# Patient Record
Sex: Male | Born: 1970 | Race: Black or African American | Hispanic: No | Marital: Single | Smoking: Never smoker
Health system: Southern US, Community
[De-identification: ages and names within clinical notes are randomized; demographics above are authoritative.]

---

## 2000-04-16 ENCOUNTER — Emergency Department (HOSPITAL_COMMUNITY): Admission: EM | Admit: 2000-04-16 | Discharge: 2000-04-16 | Payer: Self-pay | Admitting: Emergency Medicine

## 2000-05-29 ENCOUNTER — Encounter: Payer: Self-pay | Admitting: Emergency Medicine

## 2000-05-29 ENCOUNTER — Emergency Department (HOSPITAL_COMMUNITY): Admission: EM | Admit: 2000-05-29 | Discharge: 2000-05-29 | Payer: Self-pay | Admitting: Emergency Medicine

## 2001-03-14 ENCOUNTER — Encounter: Payer: Self-pay | Admitting: Family Medicine

## 2001-03-14 ENCOUNTER — Encounter: Admission: RE | Admit: 2001-03-14 | Discharge: 2001-03-14 | Payer: Self-pay | Admitting: Family Medicine

## 2001-10-01 ENCOUNTER — Encounter: Admission: RE | Admit: 2001-10-01 | Discharge: 2001-10-01 | Payer: Self-pay | Admitting: Orthopedic Surgery

## 2001-10-01 ENCOUNTER — Encounter: Payer: Self-pay | Admitting: Orthopedic Surgery

## 2005-03-07 ENCOUNTER — Emergency Department (HOSPITAL_COMMUNITY): Admission: EM | Admit: 2005-03-07 | Discharge: 2005-03-08 | Payer: Self-pay | Admitting: Emergency Medicine

## 2005-12-28 ENCOUNTER — Emergency Department (HOSPITAL_COMMUNITY): Admission: EM | Admit: 2005-12-28 | Discharge: 2005-12-28 | Payer: Self-pay | Admitting: Emergency Medicine

## 2006-03-14 ENCOUNTER — Inpatient Hospital Stay (HOSPITAL_COMMUNITY): Admission: RE | Admit: 2006-03-14 | Discharge: 2006-03-17 | Payer: Self-pay | Admitting: *Deleted

## 2006-03-14 ENCOUNTER — Emergency Department (HOSPITAL_COMMUNITY): Admission: EM | Admit: 2006-03-14 | Discharge: 2006-03-14 | Payer: Self-pay | Admitting: Emergency Medicine

## 2006-03-15 ENCOUNTER — Ambulatory Visit: Payer: Self-pay | Admitting: *Deleted

## 2006-08-30 IMAGING — CT CT CERVICAL SPINE W/O CM
2 of 3 series · 13 of 20 positions shown, 16 images · IV contrast (agent unspecified)
Comparison: None available.

CLINICAL DATA: Neck pain following attempted suicide by hanging.  
 CERVICAL SPINE CT WITHOUT CONTRAST:
TECHNIQUE: Multidetector CT imaging of the cervical spine was performed.  Multiplanar CT image reconstructions were also generated.

[Series 3: recon 2: · axial · 0.31mm/px · z∈[+136,+301]mm · 12 of 79 slices shown, 15 images]
[im 7/79  soft-tissue]
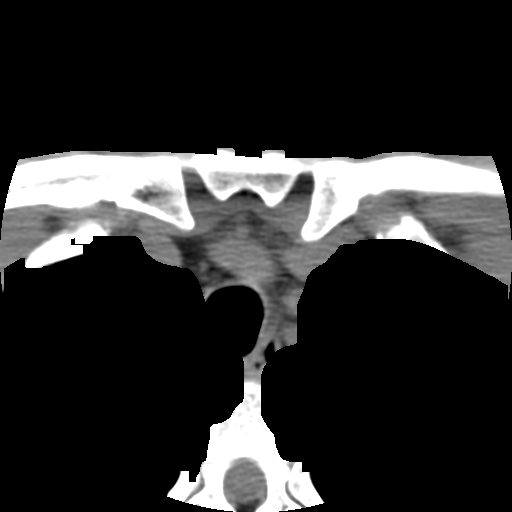
[im 7/79  bone]
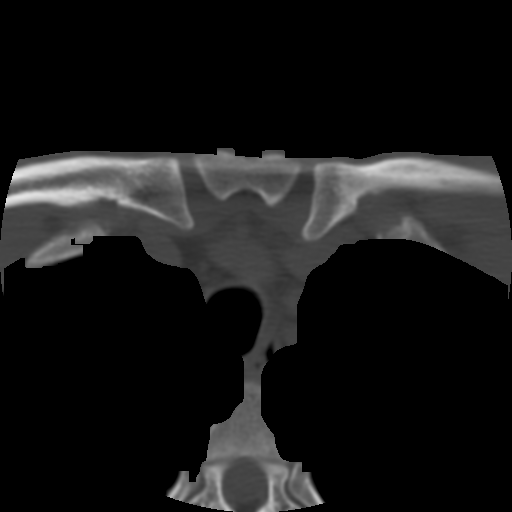
[im 13/79  bone]
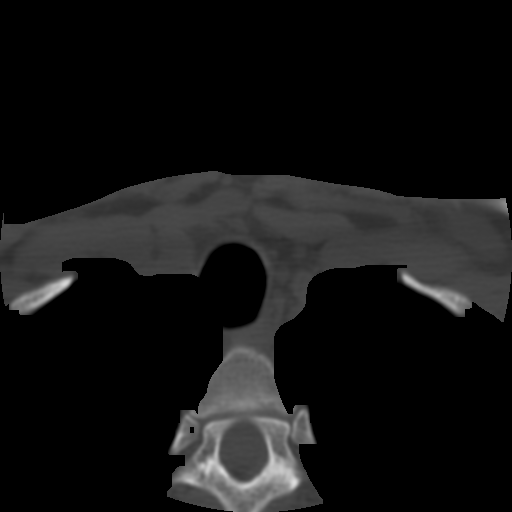
[im 19/79  bone]
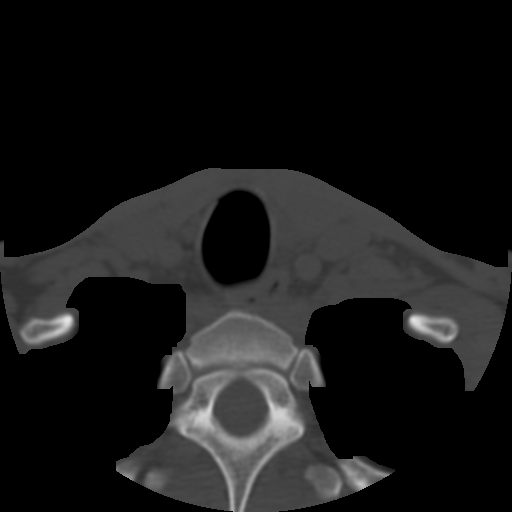
[im 25/79  bone]
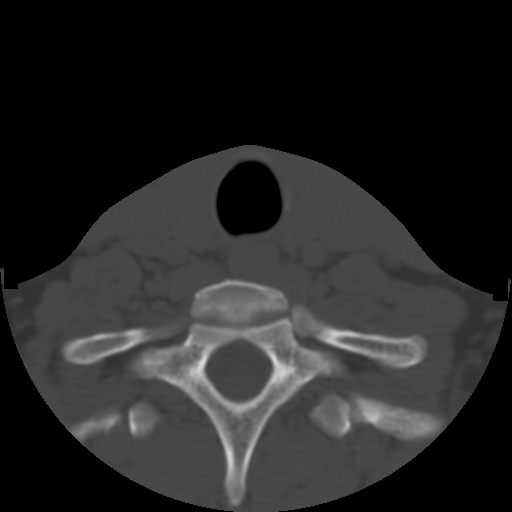
[im 31/79  soft-tissue]
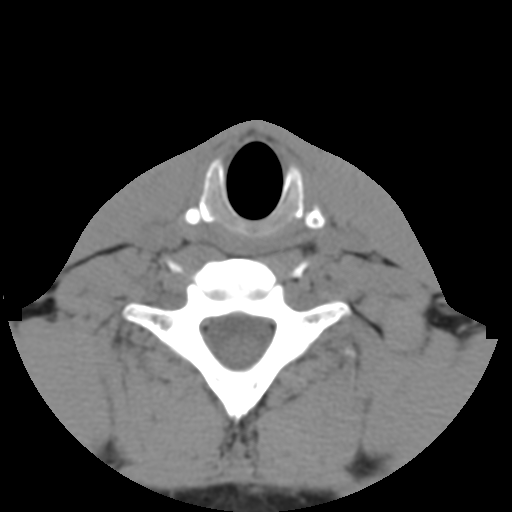
[im 31/79  bone]
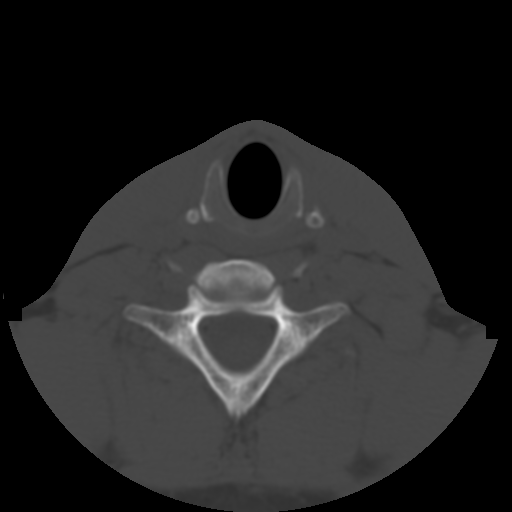
[im 37/79  bone]
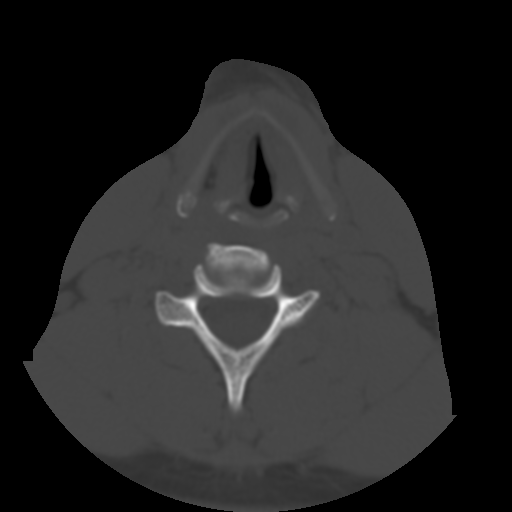
[im 43/79  bone]
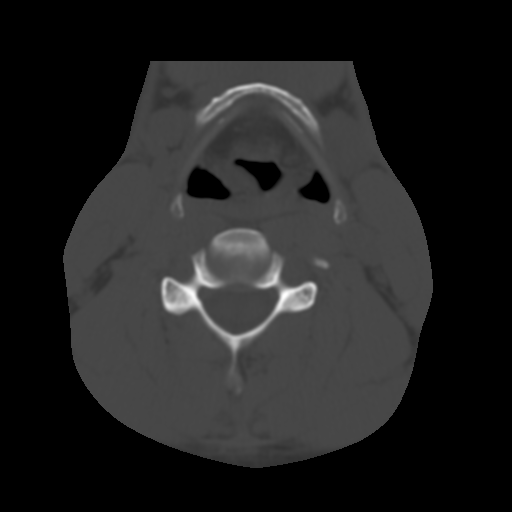
[im 49/79  bone]
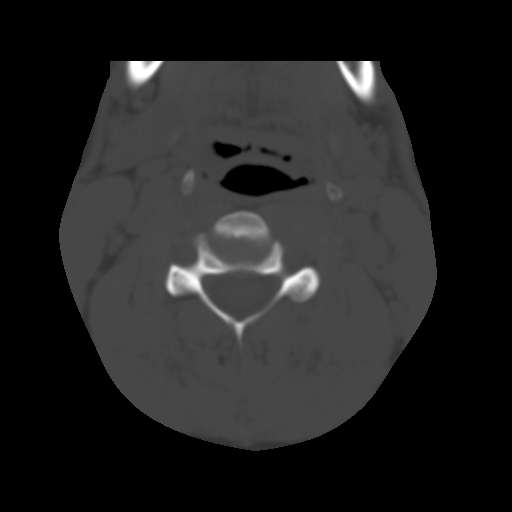
[im 55/79  soft-tissue]
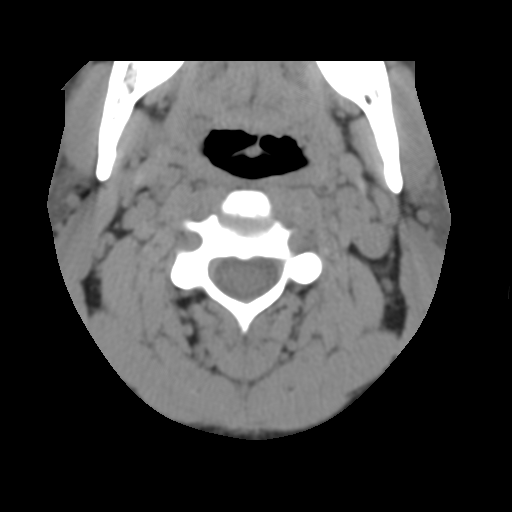
[im 55/79  bone]
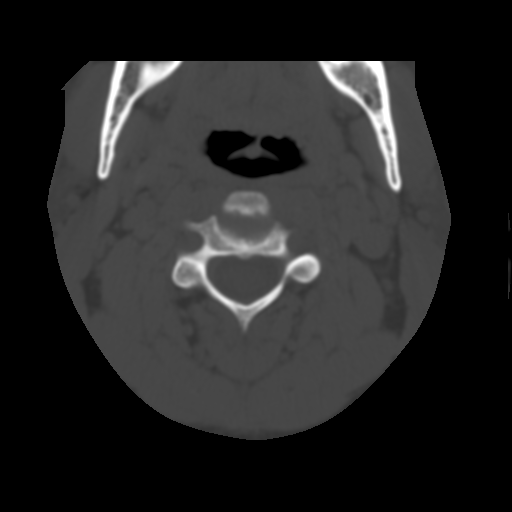
[im 61/79  bone]
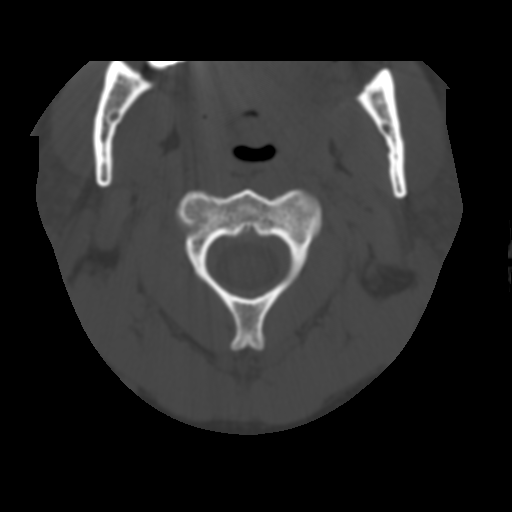
[im 67/79  bone]
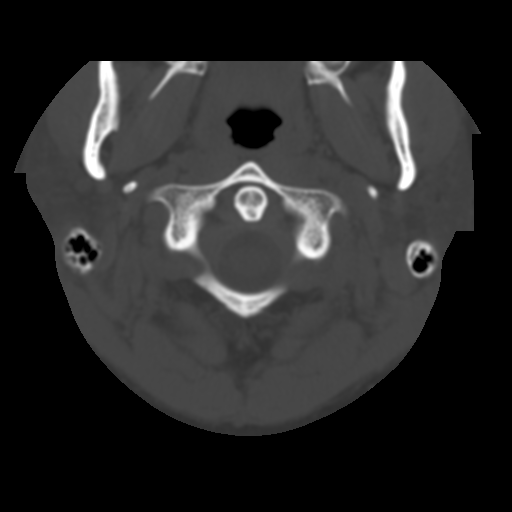
[im 73/79  bone]
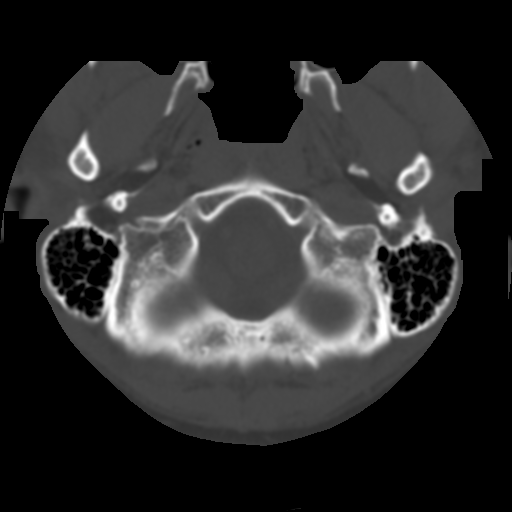

[Series 104: reformatted · coronal · 0.39mm/px · 1 of 40 slices shown]
[im 20/40  bone]
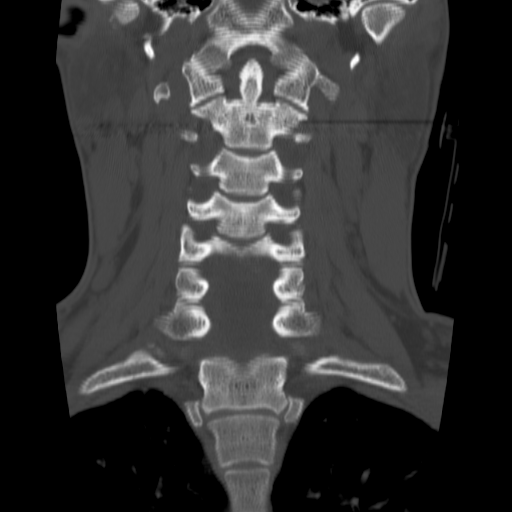

[13 of 20 positions shown; findings below may reference images not displayed]

FINDINGS: In three planes no cervical fractures or subluxations noted.  Prevertebral soft tissues normal.  Disc height is preserved.
IMPRESSION: Normal exam.

## 2011-06-16 ENCOUNTER — Emergency Department (HOSPITAL_COMMUNITY)
Admission: EM | Admit: 2011-06-16 | Discharge: 2011-06-16 | Disposition: A | Discharge status: Home / Self Care | Payer: BC Managed Care – PPO | Attending: Emergency Medicine | Admitting: Emergency Medicine

## 2011-06-16 ENCOUNTER — Emergency Department (HOSPITAL_COMMUNITY): Payer: BC Managed Care – PPO

## 2011-06-16 DIAGNOSIS — R0602 Shortness of breath: Secondary | ICD-10-CM | POA: Insufficient documentation

## 2011-06-16 DIAGNOSIS — R079 Chest pain, unspecified: Secondary | ICD-10-CM | POA: Insufficient documentation

## 2011-06-16 DIAGNOSIS — K209 Esophagitis, unspecified without bleeding: Secondary | ICD-10-CM | POA: Insufficient documentation

## 2011-06-16 LAB — CK TOTAL AND CKMB (NOT AT ARMC)
Relative Index: 1.1 (ref 0.0–2.5)
Total CK: 114 U/L (ref 7–232)

## 2011-06-16 LAB — RAPID URINE DRUG SCREEN, HOSP PERFORMED: Opiates: POSITIVE — AB

## 2011-06-16 LAB — COMPREHENSIVE METABOLIC PANEL
ALT: 22 U/L (ref 0–53)
Albumin: 3.6 g/dL (ref 3.5–5.2)
Alkaline Phosphatase: 61 U/L (ref 39–117)
BUN: 16 mg/dL (ref 6–23)
Calcium: 9.3 mg/dL (ref 8.4–10.5)
GFR calc Af Amer: >60 mL/min (ref >60)
Glucose, Bld: 100 mg/dL — ABNORMAL HIGH (ref 70–99)
Potassium: 4.8 mEq/L (ref 3.5–5.1)
Sodium: 138 mEq/L (ref 135–145)
Total Protein: 7.1 g/dL (ref 6.0–8.3)

## 2011-06-16 LAB — DIFFERENTIAL
Basophils Absolute: 0 10*3/uL (ref 0.0–0.1)
Basophils Relative: 0 % (ref 0–1)
Eosinophils Relative: 3 % (ref 0–5)
Monocytes Absolute: 0.3 10*3/uL (ref 0.1–1.0)
Monocytes Relative: 9 % (ref 3–12)

## 2011-06-16 LAB — TROPONIN I: Troponin I: <0.3 ng/mL (ref <0.30)

## 2011-06-16 LAB — CBC
Hemoglobin: 13.5 g/dL (ref 13.0–17.0)
MCH: 27.3 pg (ref 26.0–34.0)
MCHC: 33.2 g/dL (ref 30.0–36.0)
RDW: 14.1 % (ref 11.5–15.5)

## 2012-02-16 IMAGING — CR DG CHEST 1V PORT
1 series · 1 of 1 positions shown · non-contrast
Comparison: None

CLINICAL DATA: Chest pain and shortness of breath.

PORTABLE CHEST - 1 VIEW

[AP]
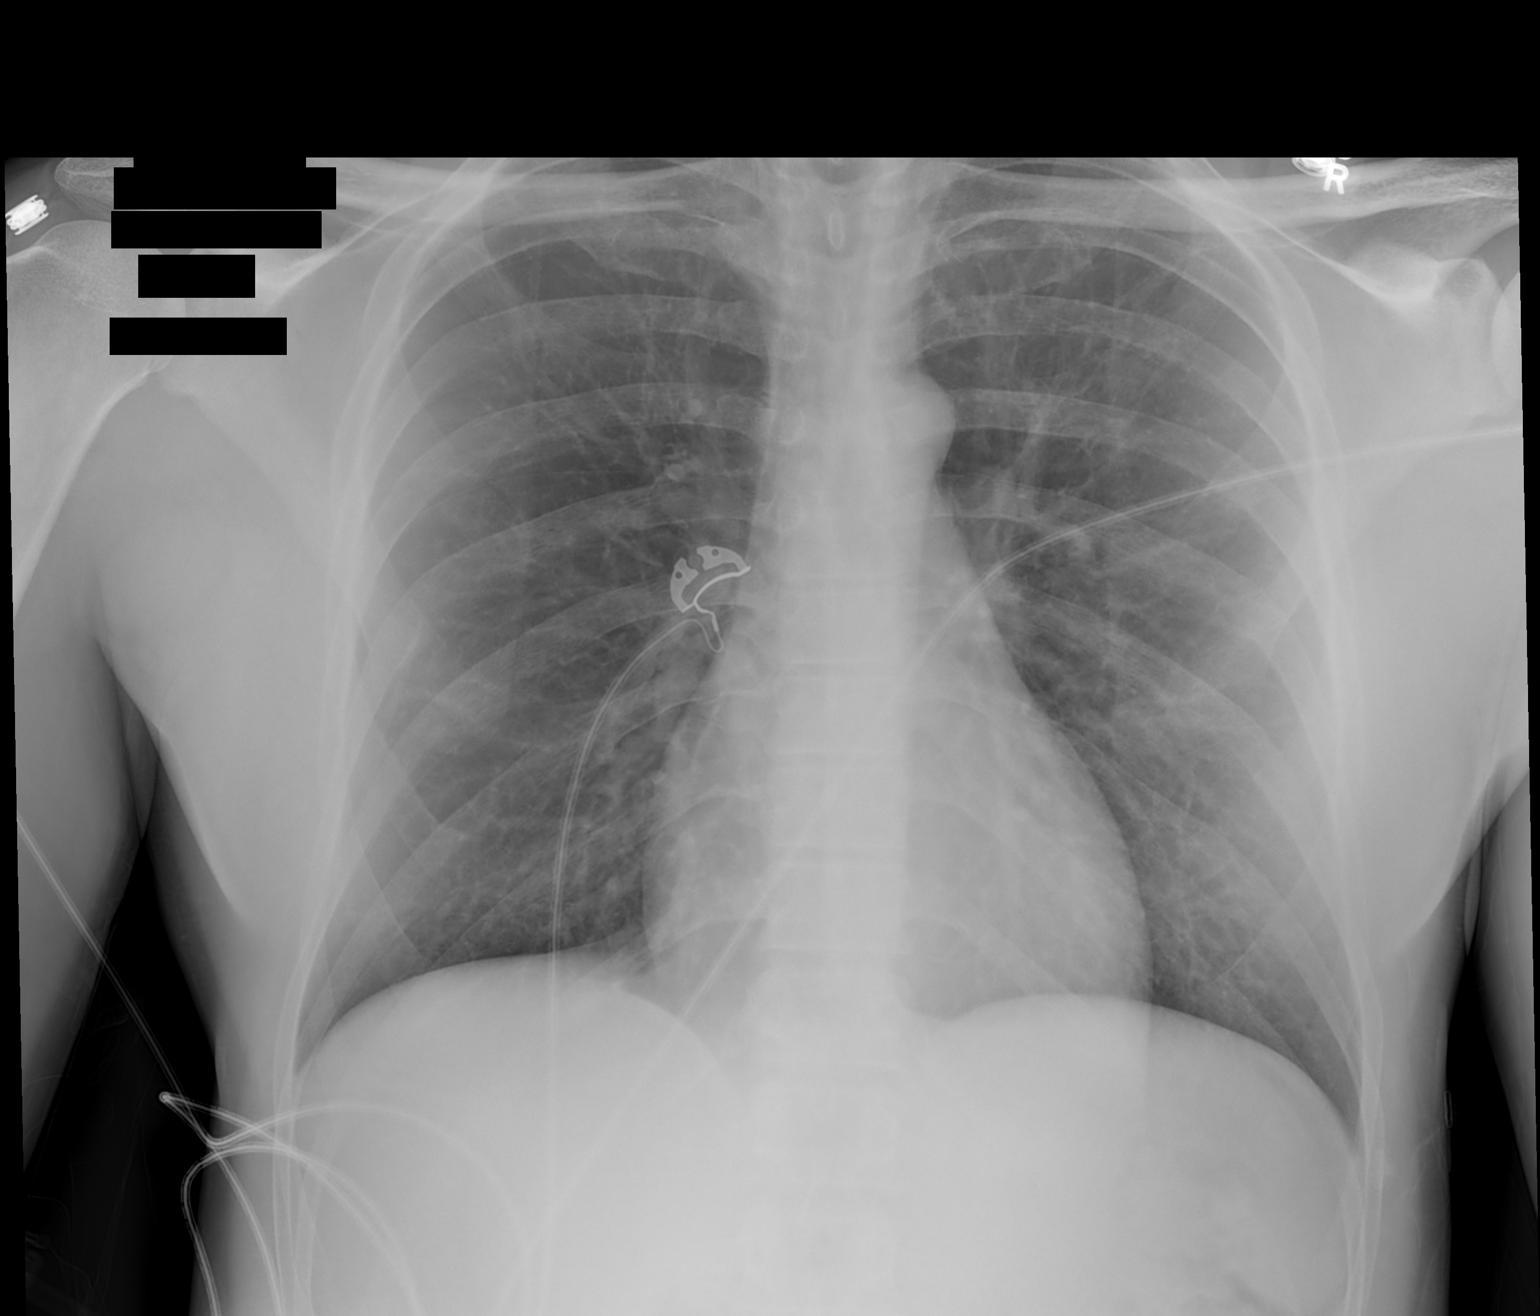

[1 of 1 positions shown; findings below may reference images not displayed]

FINDINGS: The cardiac silhouette, mediastinal and hilar contours
are within normal limits.  The lungs are clear.  The bony thorax is
intact.
IMPRESSION: No acute cardiopulmonary findings.

## 2016-09-12 DIAGNOSIS — L0232 Furuncle of buttock: Secondary | ICD-10-CM | POA: Diagnosis not present

## 2016-09-12 DIAGNOSIS — L304 Erythema intertrigo: Secondary | ICD-10-CM | POA: Diagnosis not present

## 2016-10-09 DIAGNOSIS — R350 Frequency of micturition: Secondary | ICD-10-CM | POA: Diagnosis not present

## 2016-10-09 DIAGNOSIS — I10 Essential (primary) hypertension: Secondary | ICD-10-CM | POA: Diagnosis not present

## 2016-10-31 DIAGNOSIS — N5201 Erectile dysfunction due to arterial insufficiency: Secondary | ICD-10-CM | POA: Diagnosis not present

## 2016-10-31 DIAGNOSIS — N400 Enlarged prostate without lower urinary tract symptoms: Secondary | ICD-10-CM | POA: Diagnosis not present

## 2017-03-12 DIAGNOSIS — Z131 Encounter for screening for diabetes mellitus: Secondary | ICD-10-CM | POA: Diagnosis not present

## 2017-03-12 DIAGNOSIS — J45909 Unspecified asthma, uncomplicated: Secondary | ICD-10-CM | POA: Diagnosis not present

## 2017-03-12 DIAGNOSIS — Z0001 Encounter for general adult medical examination with abnormal findings: Secondary | ICD-10-CM | POA: Diagnosis not present

## 2017-03-12 DIAGNOSIS — Z125 Encounter for screening for malignant neoplasm of prostate: Secondary | ICD-10-CM | POA: Diagnosis not present

## 2017-03-12 DIAGNOSIS — J040 Acute laryngitis: Secondary | ICD-10-CM | POA: Diagnosis not present

## 2017-03-12 DIAGNOSIS — Z13220 Encounter for screening for lipoid disorders: Secondary | ICD-10-CM | POA: Diagnosis not present

## 2017-03-12 DIAGNOSIS — Z23 Encounter for immunization: Secondary | ICD-10-CM | POA: Diagnosis not present

## 2017-08-09 DIAGNOSIS — M25512 Pain in left shoulder: Secondary | ICD-10-CM | POA: Diagnosis not present

## 2017-08-29 DIAGNOSIS — M25512 Pain in left shoulder: Secondary | ICD-10-CM | POA: Diagnosis not present

## 2017-09-10 DIAGNOSIS — S46812A Strain of other muscles, fascia and tendons at shoulder and upper arm level, left arm, initial encounter: Secondary | ICD-10-CM | POA: Diagnosis not present

## 2017-09-21 DIAGNOSIS — S46812D Strain of other muscles, fascia and tendons at shoulder and upper arm level, left arm, subsequent encounter: Secondary | ICD-10-CM | POA: Diagnosis not present

## 2017-09-26 DIAGNOSIS — S43432D Superior glenoid labrum lesion of left shoulder, subsequent encounter: Secondary | ICD-10-CM | POA: Diagnosis not present

## 2017-10-16 DIAGNOSIS — S43432A Superior glenoid labrum lesion of left shoulder, initial encounter: Secondary | ICD-10-CM | POA: Diagnosis not present

## 2017-10-16 DIAGNOSIS — M24112 Other articular cartilage disorders, left shoulder: Secondary | ICD-10-CM | POA: Diagnosis not present

## 2017-10-16 DIAGNOSIS — G8918 Other acute postprocedural pain: Secondary | ICD-10-CM | POA: Diagnosis not present

## 2017-10-16 DIAGNOSIS — M7542 Impingement syndrome of left shoulder: Secondary | ICD-10-CM | POA: Diagnosis not present

## 2017-10-16 DIAGNOSIS — S46912A Strain of unspecified muscle, fascia and tendon at shoulder and upper arm level, left arm, initial encounter: Secondary | ICD-10-CM | POA: Diagnosis not present

## 2017-10-16 DIAGNOSIS — M25512 Pain in left shoulder: Secondary | ICD-10-CM | POA: Diagnosis not present

## 2017-10-16 DIAGNOSIS — I890 Lymphedema, not elsewhere classified: Secondary | ICD-10-CM | POA: Diagnosis not present

## 2017-10-27 DIAGNOSIS — M25512 Pain in left shoulder: Secondary | ICD-10-CM | POA: Diagnosis not present

## 2017-10-27 DIAGNOSIS — S46912A Strain of unspecified muscle, fascia and tendon at shoulder and upper arm level, left arm, initial encounter: Secondary | ICD-10-CM | POA: Diagnosis not present

## 2017-10-27 DIAGNOSIS — S43432A Superior glenoid labrum lesion of left shoulder, initial encounter: Secondary | ICD-10-CM | POA: Diagnosis not present

## 2017-10-27 DIAGNOSIS — I890 Lymphedema, not elsewhere classified: Secondary | ICD-10-CM | POA: Diagnosis not present

## 2017-11-26 DIAGNOSIS — M25612 Stiffness of left shoulder, not elsewhere classified: Secondary | ICD-10-CM | POA: Diagnosis not present

## 2017-12-03 DIAGNOSIS — M25612 Stiffness of left shoulder, not elsewhere classified: Secondary | ICD-10-CM | POA: Diagnosis not present

## 2017-12-07 DIAGNOSIS — M25612 Stiffness of left shoulder, not elsewhere classified: Secondary | ICD-10-CM | POA: Diagnosis not present

## 2017-12-11 DIAGNOSIS — M25612 Stiffness of left shoulder, not elsewhere classified: Secondary | ICD-10-CM | POA: Diagnosis not present

## 2017-12-14 DIAGNOSIS — M25612 Stiffness of left shoulder, not elsewhere classified: Secondary | ICD-10-CM | POA: Diagnosis not present

## 2017-12-17 DIAGNOSIS — M25612 Stiffness of left shoulder, not elsewhere classified: Secondary | ICD-10-CM | POA: Diagnosis not present

## 2017-12-21 DIAGNOSIS — M25612 Stiffness of left shoulder, not elsewhere classified: Secondary | ICD-10-CM | POA: Diagnosis not present

## 2017-12-26 DIAGNOSIS — S43432D Superior glenoid labrum lesion of left shoulder, subsequent encounter: Secondary | ICD-10-CM | POA: Diagnosis not present

## 2017-12-26 DIAGNOSIS — M7542 Impingement syndrome of left shoulder: Secondary | ICD-10-CM | POA: Diagnosis not present

## 2018-01-21 DIAGNOSIS — Z4789 Encounter for other orthopedic aftercare: Secondary | ICD-10-CM | POA: Diagnosis not present

## 2018-01-21 DIAGNOSIS — S46812D Strain of other muscles, fascia and tendons at shoulder and upper arm level, left arm, subsequent encounter: Secondary | ICD-10-CM | POA: Diagnosis not present

## 2018-04-10 DIAGNOSIS — R21 Rash and other nonspecific skin eruption: Secondary | ICD-10-CM | POA: Diagnosis not present

## 2018-04-10 DIAGNOSIS — L299 Pruritus, unspecified: Secondary | ICD-10-CM | POA: Diagnosis not present

## 2018-05-29 DIAGNOSIS — Z760 Encounter for issue of repeat prescription: Secondary | ICD-10-CM | POA: Diagnosis not present

## 2018-05-29 DIAGNOSIS — B009 Herpesviral infection, unspecified: Secondary | ICD-10-CM | POA: Diagnosis not present

## 2018-10-15 DIAGNOSIS — B356 Tinea cruris: Secondary | ICD-10-CM | POA: Diagnosis not present

## 2019-06-09 DIAGNOSIS — B372 Candidiasis of skin and nail: Secondary | ICD-10-CM | POA: Diagnosis not present

## 2019-08-13 DIAGNOSIS — U071 COVID-19: Secondary | ICD-10-CM | POA: Diagnosis not present

## 2019-12-06 DIAGNOSIS — Z0131 Encounter for examination of blood pressure with abnormal findings: Secondary | ICD-10-CM | POA: Diagnosis not present

## 2020-04-13 DIAGNOSIS — L739 Follicular disorder, unspecified: Secondary | ICD-10-CM | POA: Diagnosis not present

## 2020-04-13 DIAGNOSIS — B356 Tinea cruris: Secondary | ICD-10-CM | POA: Diagnosis not present

## 2020-04-13 DIAGNOSIS — J45909 Unspecified asthma, uncomplicated: Secondary | ICD-10-CM | POA: Diagnosis not present

## 2020-05-10 DIAGNOSIS — B009 Herpesviral infection, unspecified: Secondary | ICD-10-CM | POA: Diagnosis not present

## 2020-05-10 DIAGNOSIS — J45909 Unspecified asthma, uncomplicated: Secondary | ICD-10-CM | POA: Diagnosis not present

## 2020-06-09 DIAGNOSIS — R030 Elevated blood-pressure reading, without diagnosis of hypertension: Secondary | ICD-10-CM | POA: Diagnosis not present

## 2020-06-09 DIAGNOSIS — N529 Male erectile dysfunction, unspecified: Secondary | ICD-10-CM | POA: Diagnosis not present

## 2020-06-09 DIAGNOSIS — J4520 Mild intermittent asthma, uncomplicated: Secondary | ICD-10-CM | POA: Diagnosis not present

## 2020-06-09 DIAGNOSIS — Z125 Encounter for screening for malignant neoplasm of prostate: Secondary | ICD-10-CM | POA: Diagnosis not present

## 2020-06-09 DIAGNOSIS — Z131 Encounter for screening for diabetes mellitus: Secondary | ICD-10-CM | POA: Diagnosis not present

## 2020-06-09 DIAGNOSIS — Z0000 Encounter for general adult medical examination without abnormal findings: Secondary | ICD-10-CM | POA: Diagnosis not present

## 2020-06-09 DIAGNOSIS — R413 Other amnesia: Secondary | ICD-10-CM | POA: Diagnosis not present

## 2020-06-09 DIAGNOSIS — Z13220 Encounter for screening for lipoid disorders: Secondary | ICD-10-CM | POA: Diagnosis not present

## 2022-07-14 DIAGNOSIS — B356 Tinea cruris: Secondary | ICD-10-CM | POA: Diagnosis not present

## 2022-10-19 DIAGNOSIS — S0500XA Injury of conjunctiva and corneal abrasion without foreign body, unspecified eye, initial encounter: Secondary | ICD-10-CM | POA: Diagnosis not present

## 2023-04-09 ENCOUNTER — Emergency Department (HOSPITAL_BASED_OUTPATIENT_CLINIC_OR_DEPARTMENT_OTHER): Payer: Worker's Compensation | Admitting: Radiology

## 2023-04-09 ENCOUNTER — Encounter (HOSPITAL_BASED_OUTPATIENT_CLINIC_OR_DEPARTMENT_OTHER): Payer: Self-pay

## 2023-04-09 ENCOUNTER — Other Ambulatory Visit: Payer: Self-pay

## 2023-04-09 DIAGNOSIS — H538 Other visual disturbances: Secondary | ICD-10-CM | POA: Insufficient documentation

## 2023-04-09 DIAGNOSIS — R0602 Shortness of breath: Secondary | ICD-10-CM | POA: Insufficient documentation

## 2023-04-09 DIAGNOSIS — Z77098 Contact with and (suspected) exposure to other hazardous, chiefly nonmedicinal, chemicals: Secondary | ICD-10-CM | POA: Insufficient documentation

## 2023-04-09 LAB — COMPREHENSIVE METABOLIC PANEL
ALT: 11 U/L (ref 0–44)
AST: 13 U/L — ABNORMAL LOW (ref 15–41)
Albumin: 4.5 g/dL (ref 3.5–5.0)
Alkaline Phosphatase: 63 U/L (ref 38–126)
Anion gap: 9 (ref 5–15)
BUN: 12 mg/dL (ref 6–20)
CO2: 24 mmol/L (ref 22–32)
Calcium: 8.8 mg/dL — ABNORMAL LOW (ref 8.9–10.3)
Chloride: 105 mmol/L (ref 98–111)
Creatinine, Ser: 1.14 mg/dL (ref 0.61–1.24)
GFR, Estimated: >60 mL/min (ref >60)
Glucose, Bld: 121 mg/dL — ABNORMAL HIGH (ref 70–99)
Potassium: 3.8 mmol/L (ref 3.5–5.1)
Sodium: 138 mmol/L (ref 135–145)
Total Bilirubin: 0.6 mg/dL (ref 0.3–1.2)
Total Protein: 7.2 g/dL (ref 6.5–8.1)

## 2023-04-09 LAB — CBC
HCT: 40.6 % (ref 39.0–52.0)
Hemoglobin: 13.8 g/dL (ref 13.0–17.0)
MCH: 28.2 pg (ref 26.0–34.0)
MCHC: 34 g/dL (ref 30.0–36.0)
MCV: 83 fL (ref 80.0–100.0)
Platelets: 192 10*3/uL (ref 150–400)
RBC: 4.89 MIL/uL (ref 4.22–5.81)
RDW: 15.1 % (ref 11.5–15.5)
WBC: 5.1 10*3/uL (ref 4.0–10.5)
nRBC: 0 % (ref 0.0–0.2)

## 2023-04-09 NOTE — ED Triage Notes (Addendum)
Patient here POV from Home.  Endorses being exposed to Chemical Exposure in February. Had Headache, Blurred Vision and Nausea originally. Went to see MD today and FEV1 was assessed.   Still endorses headache, Blurred Vision, and Joint pain and was sent today by MD for "lung test"  NAD Noted during Triage. A&Ox4. GCS 15. Ambulatory.

## 2023-04-10 ENCOUNTER — Emergency Department (HOSPITAL_BASED_OUTPATIENT_CLINIC_OR_DEPARTMENT_OTHER)
Admission: EM | Admit: 2023-04-10 | Discharge: 2023-04-10 | Disposition: A | Discharge status: Home / Self Care | Payer: Worker's Compensation | Attending: Emergency Medicine | Admitting: Emergency Medicine

## 2023-04-10 DIAGNOSIS — Z77098 Contact with and (suspected) exposure to other hazardous, chiefly nonmedicinal, chemicals: Secondary | ICD-10-CM

## 2023-04-10 DIAGNOSIS — H538 Other visual disturbances: Secondary | ICD-10-CM

## 2023-04-10 DIAGNOSIS — R0602 Shortness of breath: Secondary | ICD-10-CM

## 2023-04-10 NOTE — ED Provider Notes (Signed)
EMERGENCY DEPARTMENT AT [facility] Provider Note   CSN: [id number] Arrival date & time: 04/09/23  2124     History  Chief Complaint  Patient presents with   Chemical Exposure    Gregory Byrd is a 52 y.o. male.  This patient is an otherwise healthy 52 year old male presenting with complaints of of a chemical exposure.  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  He reports that this got into his eyes and mouth.  He immediately wiped it off.  He reports experiencing blurry vision, shortness of breath, headaches, and joint pain since immediately after this incident.  It sounds as though he was seen by his employer's medical team and was advised to come to the ER due to him experiencing shortness of breath.  The history is provided by the patient.       Home Medications Prior to Admission medications   Not on File      Allergies    Patient has no known allergies.    Review of Systems   Review of Systems  All other systems reviewed and are negative.   Physical Exam Updated Vital Signs BP (!) 162/93 (BP Location: Left Arm)   Pulse 60   Temp 98.4 F (36.9 C) (Oral)   Resp 18   Ht 5\' 9"  (1.753 m)   Wt 72.6 kg   SpO2 98%   BMI 23.63 kg/m  Physical Exam Vitals and nursing note reviewed.  Constitutional:      General: He is not in acute distress.    Appearance: He is well-developed. He is not diaphoretic.  HENT:     Head: Normocephalic and atraumatic.  Eyes:     Extraocular Movements: Extraocular movements intact.     Conjunctiva/sclera: Conjunctivae normal.     Pupils: Pupils are equal, round, and reactive to light.  Cardiovascular:     Rate and Rhythm: Normal rate and regular rhythm.     Heart sounds: No murmur heard.    No friction rub.  Pulmonary:     Effort: Pulmonary effort is normal. No respiratory distress.      Breath sounds: Normal breath sounds. No wheezing or rales.  Abdominal:     General: Bowel sounds are normal. There is no distension.     Palpations: Abdomen is soft.     Tenderness: There is no abdominal tenderness.  Musculoskeletal:        General: Normal range of motion.     Cervical back: Normal range of motion and neck supple.  Skin:    General: Skin is warm and dry.  Neurological:     Mental Status: He is alert and oriented to person, place, and time.     Coordination: Coordination normal.     ED Results / Procedures / Treatments   Labs (all labs ordered are listed, but only abnormal results are displayed) Labs Reviewed  COMPREHENSIVE METABOLIC PANEL - Abnormal; Notable for the following components:      Result Value   Glucose, Bld 121 (*)    Calcium 8.8 (*)    AST 13 (*)    All other components within normal limits  CBC    EKG None  Radiology DG Chest 2 View  Result Date: 04/09/2023 CLINICAL DATA:  Shortness of breath. EXAM: CHEST - 2 VIEW COMPARISON:  06/16/2019. FINDINGS: The heart size and mediastinal contours  are within normal limits. Both lungs are clear. No acute osseous abnormality. IMPRESSION: No active cardiopulmonary disease. Electronically Signed   By: Thornell Sartorius M.D.   On: 04/09/2023 22:27    Procedures Procedures    Medications Ordered in ED Medications - No data to display  ED Course/ Medical Decision Making/ A&P  Patient presenting with multiple complaints as described in the HPI.  He reports this started after being exposed to hydraulic fluid at his place of employment.  Patient arrives here with stable vital signs, no hypoxia, and is clinically well-appearing.  Physical examination is unremarkable.  Laboratory studies obtained in triage including CBC and CMP, both of which were unremarkable.  Chest x-ray shows no active cardiopulmonary disease.  I see nothing emergent in today's complaints or workup and feel as though patient can safely be  discharged to home.  Due to his shortness of breath and reported abnormal FEV1 test by his employer, I will refer him to pulmonology.  Patient already has an eye doctor and will follow-up with them.  Final Clinical Impression(s) / ED Diagnoses Final diagnoses:  None    Rx / DC Orders ED Discharge Orders     None         Geoffery Lyons, MD 04/10/23 0150

## 2023-04-10 NOTE — Discharge Instructions (Signed)
Follow-up with pulmonology in the next week.  The contact information for [facility] pulmonology has been provided in this discharge summary for you to call and make these arrangements.  Follow-up with your eye doctor for a vision test and eye exam..

## 2023-04-25 NOTE — Progress Notes (Unsigned)
OV 04/26/2023  Subjective:  Patient ID: Gregory Byrd, male , DOB: [birth year] , age 52 y.o. , MRN: [id number] , ADDRESS: [address] [place] [address] PCP Pcp, No Patient Care Team: Pcp, No as PCP - General  This Provider for this visit: Treatment Team:  Attending Provider: Kalman Shan, MD    04/26/2023 -   Chief Complaint  Patient presents with   Consult    Workers comp.  On forklift, hydrolic lines exploded. Patient got hydrolic fluid splashed in mouth and in face.  Caused headaches and blurred vision.  Incident happened 6 months ago     HPI Gregory Byrd 52 y.o. 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  They went to the main manufactures for hydraulic problems that is seen in car trunks and chairs.  He commutes to [place] and come back to [place].  He joined this job in October 2023.  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  His wife is Interior and spatial designer of HR at [employer].  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  He was wearing goggles but it got into his eyes and mouth.  He then wiped it off.  But then some 30-60 minutes later he started feeling short of breath.  He then went outside to get some fresh air.  Later that night he started having headaches.  Then 2 to 3 days status started having blurred vision.  A few weeks later started developing dry cough along with wheezing.  All the symptoms are persisting.  In fact the blurred vision is worse.  He continues to have headaches.  He is also having fatigue.  His shortness of breath is present particularly with exertion when he climbs 2 flights of stairs at home.  His cough is ongoing.  He says the day after the incident he did seek medical help and was seen by a physician here in  [place] and was advised to go to the emergency room.  His work as a strict 90-day note time off policy.  Therefore he is "fought through" and then only after his 90-day.  He started seekubg medical help in detail only now.  Unclear to me if he seen a neurologist  He does not have a prior history of asthma.  There is no cat in the house no dogs.  No pet birds no mildew no mold.  No down pillow.  No tobacco use no marijuana use no cocaine use.  He says he lives in a good newer home.   Sit/stand exercise hypoxemia test.  His resting pulse ox was 97%.  After sitting and standing 10 times his lowest pulse ox was again only 97%.  Okay he did not get dyspneic but felt mildly dizzy.  He did have a chest x-ray May 2024.  Personally visualized.  Agree it is clear.  CXR 04/09/23  arrative & Impression  CLINICAL DATA:  Shortness of breath.   EXAM: CHEST - 2 VIEW   COMPARISON:  06/16/2019.   FINDINGS: The heart size and mediastinal contours are within normal limits. Both lungs are clear. No acute osseous abnormality.   IMPRESSION: No active cardiopulmonary disease.     Electronically Signed   By: Vernona Rieger  Ladona Ridgel M.D.   On: 04/09/2023 22:27       PFT      No data to display           Latest Reference Range & Units 06/16/11 12:15 04/09/23 21:39  Creatinine 0.61 - 1.24 mg/dL 1.61 0.96    Latest Reference Range & Units 06/16/11 12:15 04/09/23 21:39  AST 15 - 41 U/L 20 13 (L)  ALT 0 - 44 U/L 22 11  (L): Data is abnormally low   has no past medical history on file.   reports that he has never smoked. He has never used smokeless tobacco.  No past surgical history on file.  No Known Allergies   There is no immunization history on file for this patient.  No family history on file.  No current outpatient medications on file.      Objective:   Vitals:   04/26/23 0831  BP: 122/84  Pulse: (!) 59  Temp: 97.6 F (36.4 C)  TempSrc: Oral  SpO2: 98%  Weight: 164 lb  (74.4 kg)  Height: 5\' 9"  (1.753 m)    Estimated body mass index is 24.22 kg/m as calculated from the following:   Height as of this encounter: 5\' 9"  (1.753 m).   Weight as of this encounter: 164 lb (74.4 kg).  @WEIGHTCHANGE @  [employer]   04/26/23 0831  Weight: 164 lb (74.4 kg)     Physical Exam   General: No distress. Looks well O2 at rest: no Cane present: no Sitting in wheel chair: no Frail: no Obese: no Neuro: Alert and Oriented x 3. GCS 15. Speech normal Psych: Pleasant Resp:  Barrel Chest - no.  Wheeze - no, Crackles - no, No overt respiratory distress CVS: Normal heart sounds. Murmurs - no Ext: Stigmata of Connective Tissue Disease - no HEENT: Normal upper airway. PEERL +. No post nasal drip        Assessment:       ICD-10-CM   1. DOE (dyspnea on exertion)  R06.09     2. Chronic cough  R05.3     3. Wheeze  R06.2     4. H/O chemical exposure  Z87.898          Plan:     Patient Instructions     ICD-10-CM   1. DOE (dyspnea on exertion)  R06.09     2. Chronic cough  R05.3     3. Wheeze  R06.2     4. H/O chemical exposure  Z87.898      There is a condition called RADS there is an asthma-like condition that can happen after exposure to chemical inhalation.  This could be that or it could be something else  Plan - Do full pulmonary function test - do feno test 04/26/2023 - Do CBC with differential, blood IgE and RAST allergy panel. -Do high-resolution CT chest supine and prone - Do echocardiogram - see neurologist for headache and blurred vision; can refer if you wish  Follow-up - Return to see nurse practitioner or Dr. Marchelle Gearing video visit in 4 weeks to discuss the test results.;  - do CPST if all tests are negative    SIGNATURE    Dr. Kalman Shan, M.D., F.C.C.P,  Pulmonary and Critical Care Medicine Staff Physician, [facility] Director - Interstitial Lung Disease  Program  Pulmonary Fibrosis [facility] at [facility] [place], [place], [address]  Pager: [phone], If no answer or between  15:00h - 7:00h: call [phone] Telephone: [phone]  9:06 AM 04/26/2023

## 2023-04-26 ENCOUNTER — Ambulatory Visit (INDEPENDENT_AMBULATORY_CARE_PROVIDER_SITE_OTHER): Payer: BC Managed Care – PPO | Admitting: Internal Medicine

## 2023-04-26 ENCOUNTER — Encounter: Payer: Self-pay | Admitting: Internal Medicine

## 2023-04-26 VITALS — BP 122/84 | HR 59 | Temp 97.6°F | Ht 69.0 in | Wt 164.0 lb

## 2023-04-26 DIAGNOSIS — Z87898 Personal history of other specified conditions: Secondary | ICD-10-CM

## 2023-04-26 DIAGNOSIS — R062 Wheezing: Secondary | ICD-10-CM

## 2023-04-26 DIAGNOSIS — R0609 Other forms of dyspnea: Secondary | ICD-10-CM

## 2023-04-26 DIAGNOSIS — R053 Chronic cough: Secondary | ICD-10-CM | POA: Diagnosis not present

## 2023-04-26 LAB — CBC WITH DIFFERENTIAL/PLATELET
Basophils Absolute: 0 10*3/uL (ref 0.0–0.1)
Basophils Relative: 0.8 % (ref 0.0–3.0)
Eosinophils Absolute: 0.1 10*3/uL (ref 0.0–0.7)
Eosinophils Relative: 3.3 % (ref 0.0–5.0)
HCT: 41.8 % (ref 39.0–52.0)
Hemoglobin: 13.6 g/dL (ref 13.0–17.0)
Lymphocytes Relative: 30.8 % (ref 12.0–46.0)
Lymphs Abs: 1.4 10*3/uL (ref 0.7–4.0)
MCHC: 32.5 g/dL (ref 30.0–36.0)
MCV: 84.9 fl (ref 78.0–100.0)
Monocytes Absolute: 0.4 10*3/uL (ref 0.1–1.0)
Monocytes Relative: 8.5 % (ref 3.0–12.0)
Neutro Abs: 2.5 10*3/uL (ref 1.4–7.7)
Neutrophils Relative %: 56.6 % (ref 43.0–77.0)
Platelets: 198 10*3/uL (ref 150.0–400.0)
RBC: 4.92 Mil/uL (ref 4.22–5.81)
RDW: 14.5 % (ref 11.5–15.5)
WBC: 4.4 10*3/uL (ref 4.0–10.5)

## 2023-04-26 NOTE — Patient Instructions (Addendum)
ICD-10-CM   1. DOE (dyspnea on exertion)  R06.09     2. Chronic cough  R05.3     3. Wheeze  R06.2     4. H/O chemical exposure  Z87.898      There is a condition called RADS there is an asthma-like condition that can happen after exposure to chemical inhalation.  This could be that or it could be something else  Plan - Do full pulmonary function test - do feno test 04/26/2023 - Do CBC with differential, blood IgE and RAST allergy panel. -Do high-resolution CT chest supine and prone - Do echocardiogram - see neurologist for headache and blurred vision; can refer if you wish  Follow-up - Return to see nurse practitioner or Dr. Marchelle Gearing video visit in 4 weeks to discuss the test results.;  - do CPST if all tests are negative

## 2023-04-27 LAB — IGE: IgE (Immunoglobulin E), Serum: 234 kU/L — ABNORMAL HIGH (ref <=114)

## 2023-04-29 LAB — ALLERGEN PROFILE, PERENNIAL ALLERGEN IGE
Alternaria Alternata IgE: <0.1 kU/L
Aspergillus Fumigatus IgE: <0.1 kU/L
Aureobasidi Pullulans IgE: <0.1 kU/L
Candida Albicans IgE: 0.36 kU/L — AB
Cat Dander IgE: 0.66 kU/L — AB
Chicken Feathers IgE: <0.1 kU/L
Cladosporium Herbarum IgE: <0.1 kU/L
Cow Dander IgE: <0.1 kU/L
D Farinae IgE: 0.93 kU/L — AB
D Pteronyssinus IgE: 0.28 kU/L — AB
Dog Dander IgE: 0.12 kU/L — AB
Duck Feathers IgE: <0.1 kU/L
Goose Feathers IgE: <0.1 kU/L
Mouse Urine IgE: <0.1 kU/L
Mucor Racemosus IgE: 0.1 kU/L — AB
Penicillium Chrysogen IgE: <0.1 kU/L
Phoma Betae IgE: <0.1 kU/L
Setomelanomma Rostrat: <0.1 kU/L
Stemphylium Herbarum IgE: <0.1 kU/L

## 2023-04-30 ENCOUNTER — Telehealth: Payer: Self-pay | Admitting: Internal Medicine

## 2023-04-30 NOTE — Telephone Encounter (Signed)
I tried calling the pt with labs and there was no answer and no option to leave msg   Regarding the original msg- I have faxed ov note to the number provided.

## 2023-04-30 NOTE — Telephone Encounter (Signed)
PT's Workmans Comp case worker needs visit notes from visit last week in order for tests to be authorized that Dr. Lenna Gilford. She would like [place] to fax it to [phone]  Jennette Dubin @ [phone] for questions.

## 2023-04-30 NOTE — Telephone Encounter (Signed)
  Blood allergy test was positive for dust mite and other things.  Plan - Refer to allergist   Latest Reference Range & Units 04/26/23 09:22  Candida Albicans IgE Class I kU/L 0.36 !  Setomelanomma Rostrat Class 0 kU/L <0.10  Aureobasidi Pullulans IgE Class 0 kU/L <0.10  Phoma Betae IgE Class 0 kU/L <0.10  !: Data is abnormal    Latest Reference Range & Units 04/26/23 09:22  WBC 4.0 - 10.5 K/uL 4.4  RBC 4.22 - 5.81 Mil/uL 4.92  Hemoglobin 13.0 - 17.0 g/dL 16.1  HCT 09.6 - 04.5 % 41.8  MCV 78.0 - 100.0 fl 84.9  MCHC 30.0 - 36.0 g/dL 40.9  RDW 81.1 - 91.4 % 14.5  Platelets 150.0 - 400.0 K/uL 198.0  Neutrophils 43.0 - 77.0 % 56.6  Lymphocytes 12.0 - 46.0 % 30.8  Monocytes Relative 3.0 - 12.0 % 8.5  Eosinophil 0.0 - 5.0 % 3.3  Basophil 0.0 - 3.0 % 0.8  NEUT# 1.4 - 7.7 K/uL 2.5  Lymphocyte # 0.7 - 4.0 K/uL 1.4  Monocyte # 0.1 - 1.0 K/uL 0.4  Eosinophils Absolute 0.0 - 0.7 K/uL 0.1  Basophils Absolute 0.0 - 0.1 K/uL 0.0  Class Description Allergens  Comment  D Pteronyssinus IgE Class 0/I kU/L 0.28 !  D Farinae IgE Class II kU/L 0.93 !  Cat Dander IgE Class II kU/L 0.66 !  Dog Dander IgE Class 0/I kU/L 0.12 !  Penicillium Chrysogen IgE Class 0 kU/L <0.10  Cladosporium Herbarum IgE Class 0 kU/L <0.10  Aspergillus Fumigatus IgE Class 0 kU/L <0.10  Mucor Racemosus IgE Class 0/I kU/L 0.10 !  Alternaria Alternata IgE Class 0 kU/L <0.10  Stemphylium Herbarum IgE Class 0 kU/L <0.10  Goose Feathers IgE Class 0 kU/L <0.10  Chicken Feathers IgE Class 0 kU/L <0.10  Duck Feathers IgE Class 0 kU/L <0.10  IgE (Immunoglobulin E), Serum <OR=114 kU/L 234 (H)  Mouse Urine IgE Class 0 kU/L <0.10  !: Data is abnormal (H): Data is abnormally high

## 2023-05-01 NOTE — Telephone Encounter (Signed)
Lawson Fiscal (Case Worker) called again. We were fax'ng to her phone number, not fax #. I printed and resent office notes.

## 2023-05-02 NOTE — Telephone Encounter (Signed)
Does Dr. Jerilynn Som want CT Scan and Echo performed as scheduled? Please call Eduard Roux [employer] Case Worker to advise @ # below.

## 2023-05-03 NOTE — Telephone Encounter (Signed)
I had to leave a message I was on hold for over 20 mins it stated they will call back in a business day

## 2023-05-03 NOTE — Telephone Encounter (Signed)
Any update on whether or not they will accept his referral? thanks

## 2023-05-03 NOTE — Telephone Encounter (Signed)
Left message for [facility] to see if they take this insurance

## 2023-05-03 NOTE — Telephone Encounter (Signed)
Patient called back states someone called him twice with no voicemail. Patient was inquiring about neurology referral- [facility] denied referral due to not seeing workers comp cases. [facility] states [facility] may take workers comp.  PCC's please advise on referral.  Triage- please advise on if patient still needs scans?

## 2023-05-03 NOTE — Telephone Encounter (Signed)
Caseworker calling again states they have not approved the neurologist so stop any action on this.   See notes below about do we still need to sched CT and Echo since blood work that was fax'd to case worker indicated this was allergies not an issue with workplace.   Case workers name and number below.

## 2023-05-04 NOTE — Telephone Encounter (Signed)
Case worker calling again. Needs office notes still. Last time they were fax'd they were sent to her call bacl # FAX # is [phone] or [phone]  Also Dr. Elvera Lennox  please see other req for information below. TY.

## 2023-05-04 NOTE — Telephone Encounter (Signed)
MR, please advise if you are still needing the echo and CT scan.

## 2023-05-06 NOTE — Telephone Encounter (Signed)
Yes he is having unexplained symptoms.  Despite the blood test positivity he needs CT scan and echo.  Please tell the case manager that

## 2023-05-07 NOTE — Telephone Encounter (Signed)
Office notes have been faxed to case worker. Closing encounter. NFN

## 2023-05-18 DIAGNOSIS — S86911A Strain of unspecified muscle(s) and tendon(s) at lower leg level, right leg, initial encounter: Secondary | ICD-10-CM

## 2023-05-21 ENCOUNTER — Other Ambulatory Visit: Payer: Self-pay

## 2023-05-21 ENCOUNTER — Other Ambulatory Visit: Payer: Self-pay | Admitting: Nurse Practitioner

## 2023-05-21 ENCOUNTER — Ambulatory Visit (HOSPITAL_COMMUNITY)
Admission: RE | Admit: 2023-05-21 | Discharge: 2023-05-21 | Disposition: A | Discharge status: Home / Self Care | Payer: Worker's Compensation | Source: Ambulatory Visit | Attending: Internal Medicine | Admitting: Internal Medicine

## 2023-05-21 DIAGNOSIS — R0600 Dyspnea, unspecified: Secondary | ICD-10-CM | POA: Insufficient documentation

## 2023-05-21 DIAGNOSIS — R0602 Shortness of breath: Secondary | ICD-10-CM | POA: Insufficient documentation

## 2023-05-21 DIAGNOSIS — R0609 Other forms of dyspnea: Secondary | ICD-10-CM | POA: Diagnosis not present

## 2023-05-21 DIAGNOSIS — S86911A Strain of unspecified muscle(s) and tendon(s) at lower leg level, right leg, initial encounter: Secondary | ICD-10-CM

## 2023-05-21 LAB — ECHOCARDIOGRAM COMPLETE
AR max vel: 2.29 cm2
AV Area VTI: 1.99 cm2
AV Area mean vel: 2.08 cm2
AV Mean grad: 5.5 mmHg
AV Peak grad: 10 mmHg
Ao pk vel: 1.58 m/s
Area-P 1/2: 3.89 cm2
Calc EF: 61.6 %
MV VTI: 2.75 cm2
S' Lateral: 3.1 cm
Single Plane A2C EF: 61.8 %
Single Plane A4C EF: 60.6 %

## 2023-05-25 ENCOUNTER — Other Ambulatory Visit: Payer: Self-pay

## 2023-05-31 ENCOUNTER — Telehealth: Payer: Self-pay | Admitting: Primary Care

## 2023-05-31 ENCOUNTER — Ambulatory Visit
Admission: RE | Admit: 2023-05-31 | Discharge: 2023-05-31 | Disposition: A | Discharge status: Home / Self Care | Payer: Worker's Compensation | Source: Ambulatory Visit | Attending: Nurse Practitioner | Admitting: Nurse Practitioner

## 2023-05-31 ENCOUNTER — Other Ambulatory Visit: Payer: BC Managed Care – PPO

## 2023-05-31 DIAGNOSIS — S86911A Strain of unspecified muscle(s) and tendon(s) at lower leg level, right leg, initial encounter: Secondary | ICD-10-CM

## 2023-05-31 NOTE — Telephone Encounter (Addendum)
Pt Case Mananger calling in for Ct Results and Echocardiogram Results  Fax: [phone]

## 2023-06-05 ENCOUNTER — Encounter: Payer: Self-pay | Admitting: Primary Care

## 2023-06-05 ENCOUNTER — Telehealth: Payer: Self-pay | Admitting: Primary Care

## 2023-06-05 ENCOUNTER — Ambulatory Visit (INDEPENDENT_AMBULATORY_CARE_PROVIDER_SITE_OTHER): Payer: BC Managed Care – PPO | Admitting: Primary Care

## 2023-06-05 ENCOUNTER — Encounter (HOSPITAL_BASED_OUTPATIENT_CLINIC_OR_DEPARTMENT_OTHER): Payer: Self-pay

## 2023-06-05 ENCOUNTER — Encounter: Payer: Self-pay | Admitting: General Surgery

## 2023-06-05 VITALS — BP 136/78 | HR 60 | Temp 98.4°F | Ht 68.0 in | Wt 168.6 lb

## 2023-06-05 DIAGNOSIS — R519 Headache, unspecified: Secondary | ICD-10-CM | POA: Diagnosis not present

## 2023-06-05 DIAGNOSIS — R0609 Other forms of dyspnea: Secondary | ICD-10-CM

## 2023-06-05 DIAGNOSIS — R053 Chronic cough: Secondary | ICD-10-CM | POA: Diagnosis not present

## 2023-06-05 LAB — POCT EXHALED NITRIC OXIDE: FeNO level (ppb): 25

## 2023-06-05 MED ORDER — ALBUTEROL SULFATE HFA 108 (90 BASE) MCG/ACT IN AERS: 2.0000 | INHALATION_SPRAY | Freq: Four times a day (QID) | RESPIRATORY_TRACT | 2 refills | Status: AC | PRN

## 2023-06-05 NOTE — Telephone Encounter (Signed)
Darilym- can you please provide Worker's Comp with copy of his echocardiogram and allergy testing. He still needs to have PFTs and HRCT.

## 2023-06-05 NOTE — Telephone Encounter (Signed)
Can we look into why patient's pulmonary function testing was canceled today at [facility], he told me he was notified yesterday that the machine was not working and PFTs for today were cancelled.

## 2023-06-05 NOTE — Progress Notes (Signed)
@Patient  ID: Gregory Byrd, male    DOB: June 05, 1971, 52 y.o.   MRN: [id number]  Chief Complaint  Patient presents with   Follow-up    SOB, H/A and blurry vision persistent since last OV.  Patient missed his PFT this morning per [facility].   Will need workers comp note regarding work restrictions.    Referring provider: No ref. provider found  HPI: 52 year old male, never smoked. No prior significant past medical history.   Previous [facility] pulmonary encounter: 04/26/2023 -  Consult. Dr. Marchelle Gearing  Chief Complaint  Patient presents with   Consult    Workers comp.  On forklift, hydrolic lines exploded. Patient got hydrolic fluid splashed in mouth and in face.  Caused headaches and blurred vision.  Incident happened 6 months ago   Gregory Byrd 52 y.o. 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  They went to the main manufactures for hydraulic problems that is seen in car trunks and chairs.  He commutes to [place] and come back to [place].  He joined this job in October 2023.  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  His wife is Interior and spatial designer of HR at [employer].  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  He was wearing goggles but it got into his eyes and mouth.  He then wiped it off.  But then some 30-60 minutes later he started feeling short of breath.  He then went outside to get some fresh air.  Later that night he started having headaches.  Then 2 to 3 days status started having blurred vision.  A few weeks later started developing dry cough along with wheezing.  All the symptoms are persisting.  In fact the blurred vision is worse.  He continues to have headaches.  He is also having fatigue.  His shortness of breath is present particularly with exertion when he climbs 2  flights of stairs at home.  His cough is ongoing.  He says the day after the incident he did seek medical help and was seen by a physician here in [place] and was advised to go to the emergency room.  His work as a strict 90-day note time off policy.  Therefore he is "fought through" and then only after his 90-day.  He started seekubg medical help in detail only now.  Unclear to me if he seen a neurologist  He does not have a prior history of asthma.  There is no cat in the house no dogs.  No pet birds no mildew no mold.  No down pillow.  No tobacco use no marijuana use no cocaine use.  He says he lives in a good newer home.   Sit/stand exercise hypoxemia test.  His resting pulse ox was 97%.  After sitting and standing 10 times his lowest pulse ox was again only 97%.  Okay he did not get dyspneic but felt mildly dizzy.  He did have a chest x-ray May 2024.  Personally visualized.  Agree it is clear.   06/05/2023- interim  Patient presents today for 1 month follow-up.  Worker's Comp. present. He ingested hydrolic fluid at work around [facility] 2024. He develop breathing issues, headaches and blurred vision occurred shortly after exposure. He has  had persistent dyspnea symptoms, cough and headaches since exposure. He experiences fatigue symptoms climbing stairs and walking long distances. Cough is not productive, needs to clear his throat because of mucus often. Symptoms can fluctuate. He works at [employer]. 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   He was not aware he had a breathing test scheduled for today. Workers compensation called to confirm 4 oclock apt and was told that his 10am PFTs was cancelled due to tech difficulty. PFTs will need to be rescheduled. He has not had HRCT imaging done, this has not been scheduled. Echocardiogram was normal. FENO today was 25.   No Known  Allergies   There is no immunization history on file for this patient.  No past medical history on file.  Tobacco History: Social History   Tobacco Use  Smoking Status Never  Smokeless Tobacco Never   Counseling given: Not Answered   Outpatient Medications Prior to Visit  Medication Sig Dispense Refill   gabapentin (NEURONTIN) 300 MG capsule Take 300 mg by mouth at bedtime as needed.     predniSONE (DELTASONE) 10 MG tablet Take 10 mg by mouth. Take as directed     No facility-administered medications prior to visit.   Review of Systems  Review of Systems  Constitutional:  Positive for fatigue.  HENT: Negative.    Respiratory:  Positive for cough and shortness of breath.   Cardiovascular: Negative.   Neurological:  Positive for headaches.   Physical Exam  BP 136/78 (BP Location: Left Arm, Patient Position: Sitting, Cuff Size: Large)   Pulse 60   Temp 98.4 F (36.9 C) (Oral)   Ht 5\' 8"  (1.727 m)   Wt 168 lb 9.6 oz (76.5 kg)   SpO2 99%   BMI 25.64 kg/m  Physical Exam Constitutional:      General: He is not in acute distress.    Appearance: Normal appearance. He is not ill-appearing.  HENT:     Head: Normocephalic and atraumatic.     Mouth/Throat:     Mouth: Mucous membranes are moist.     Pharynx: Oropharynx is clear.  Cardiovascular:     Rate and Rhythm: Normal rate and regular rhythm.  Pulmonary:     Effort: Pulmonary effort is normal.     Breath sounds: Normal breath sounds.     Comments: CTA Musculoskeletal:        General: Normal range of motion.  Skin:    General: Skin is warm and dry.  Neurological:     General: No focal deficit present.     Mental Status: He is alert and oriented to person, place, and time. Mental status is at baseline.  Psychiatric:        Mood and Affect: Mood normal.        Behavior: Behavior normal.        Thought Content: Thought content normal.        Judgment: Judgment normal.      Lab Results:  CBC     Component Value Date/Time   WBC 4.4 04/26/2023 0922   RBC 4.92 04/26/2023 0922   HGB 13.6 04/26/2023 0922   HCT 41.8 04/26/2023 0922   PLT 198.0 04/26/2023 0922   MCV 84.9 04/26/2023 0922   MCH 28.2 04/09/2023 2139   MCHC 32.5 04/26/2023 0922   RDW 14.5 04/26/2023 0922  LYMPHSABS 1.4 04/26/2023 0922   MONOABS 0.4 04/26/2023 0922   EOSABS 0.1 04/26/2023 0922   BASOSABS 0.0 04/26/2023 0922    BMET    Component Value Date/Time   NA 138 04/09/2023 2139   K 3.8 04/09/2023 2139   CL 105 04/09/2023 2139   CO2 24 04/09/2023 2139   GLUCOSE 121 (H) 04/09/2023 2139   BUN 12 04/09/2023 2139   CREATININE 1.14 04/09/2023 2139   CALCIUM 8.8 (L) 04/09/2023 2139   GFRNONAA >60 04/09/2023 2139   GFRAA >60 06/16/2011 1215    BNP No results found for: "BNP"  ProBNP No results found for: "PROBNP"  Imaging: MR KNEE RIGHT WO CONTRAST  Result Date: 06/04/2023 CLINICAL DATA:  Right knee pain after fall a month ago. EXAM: MRI OF THE RIGHT KNEE WITHOUT CONTRAST TECHNIQUE: Multiplanar, multisequence MR imaging of the right was performed. No intravenous contrast was administered. COMPARISON:  Radiographs dated March 08, 2005 FINDINGS: MENISCI Medial: Degeneration/diminution of the body. Lateral: Intact. LIGAMENTS Cruciates: ACL and PCL are intact. Collaterals: Medial collateral ligament is intact. Lateral collateral ligament complex is intact. CARTILAGE Patellofemoral: Focal full-thickness defect at the patellar apex and medial patellar facet. Articular cartilage thinning prominent of the lateral trochlea. Medial: Full-thickness cartilage defect at the weight-bearing area with subchondral edema of the medial femoral condyle. Lateral:  No chondral defect. JOINT: Small knee joint effusion. Normal Hoffa's fat-pad. No plical thickening. POPLITEAL FOSSA: Popliteus tendon is intact. No Baker's cyst. EXTENSOR MECHANISM: Intact quadriceps tendon. Intact patellar tendon. Intact lateral patellar retinaculum.  Intact medial patellar retinaculum. Intact MPFL. BONES: No aggressive osseous lesion. No fracture or dislocation. Small marginal osteophytes. Other: No fluid collection or hematoma. Muscles are normal. IMPRESSION: 1. 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 2. 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 3. Cruciate and collateral ligaments are intact. Quadriceps tendon and patellar tendon are also intact. 4. No evidence of fracture or osteonecrosis. Electronically Signed   By: Larose Hires D.O.   On: 06/04/2023 11:25   ECHOCARDIOGRAM COMPLETE  Result Date: 05/21/2023    ECHOCARDIOGRAM REPORT   Patient Name:   MAEL DELAP Date of Exam: 05/21/2023 Medical Rec #:  [id number]         Height:       69.0 in Accession #:    [id number]        Weight:       164.0 lb Date of Birth:  09-09-1971         BSA:          1.899 m Patient Age:    52 years          BP:           154/64 mmHg Patient Gender: M                 HR:           52 bpm. Exam Location:  Outpatient Procedure: 2D Echo, Cardiac Doppler and Color Doppler Indications:    SOB  History:        Patient has no prior history of Echocardiogram examinations.                 Signs/Symptoms:Shortness of Breath and Dyspnea. History of                 chemical exposure 03/2023.  Sonographer:    Wallie Char Referring Phys: [phone]  MURALI RAMASWAMY IMPRESSIONS  1. Left ventricular ejection fraction, by estimation, is 60 to 65%. The left ventricle has normal function. The left ventricle has no regional wall motion abnormalities. Left ventricular diastolic parameters were normal.  2. Right ventricular systolic function is normal. The right ventricular size is normal. There is normal pulmonary artery systolic pressure. The estimated right ventricular systolic pressure is 28.0 mmHg.  3. The mitral valve is  normal in structure. No evidence of mitral valve regurgitation. No evidence of mitral stenosis.  4. The aortic valve is tricuspid. Aortic valve regurgitation is not visualized. No aortic stenosis is present.  5. The inferior vena cava is normal in size with greater than 50% respiratory variability, suggesting right atrial pressure of 3 mmHg. FINDINGS  Left Ventricle: Left ventricular ejection fraction, by estimation, is 60 to 65%. The left ventricle has normal function. The left ventricle has no regional wall motion abnormalities. The left ventricular internal cavity size was normal in size. There is  no left ventricular hypertrophy. Left ventricular diastolic parameters were normal. Right Ventricle: The right ventricular size is normal. No increase in right ventricular wall thickness. Right ventricular systolic function is normal. There is normal pulmonary artery systolic pressure. The tricuspid regurgitant velocity is 2.50 m/s, and  with an assumed right atrial pressure of 3 mmHg, the estimated right ventricular systolic pressure is 28.0 mmHg. Left Atrium: Left atrial size was normal in size. Right Atrium: Right atrial size was normal in size. Pericardium: There is no evidence of pericardial effusion. Mitral Valve: The mitral valve is normal in structure. No evidence of mitral valve regurgitation. No evidence of mitral valve stenosis. MV peak gradient, 1.8 mmHg. The mean mitral valve gradient is 1.0 mmHg. Tricuspid Valve: The tricuspid valve is normal in structure. Tricuspid valve regurgitation is trivial. Aortic Valve: The aortic valve is tricuspid. Aortic valve regurgitation is not visualized. No aortic stenosis is present. Aortic valve mean gradient measures 5.5 mmHg. Aortic valve peak gradient measures 10.0 mmHg. Aortic valve area, by VTI measures 1.99  cm. Pulmonic Valve: The pulmonic valve was normal in structure. Pulmonic valve regurgitation is not visualized. Aorta: The aortic root is normal in size and  structure. Venous: The inferior vena cava is normal in size with greater than 50% respiratory variability, suggesting right atrial pressure of 3 mmHg. IAS/Shunts: No atrial level shunt detected by color flow Doppler.  LEFT VENTRICLE PLAX 2D LVIDd:         4.50 cm     Diastology LVIDs:         3.10 cm     LV e' medial:    7.35 cm/s LV PW:         0.90 cm     LV E/e' medial:  8.3 LV IVS:        1.10 cm     LV e' lateral:   12.40 cm/s LVOT diam:     1.90 cm     LV E/e' lateral: 4.9 LV SV:         67 LV SV Index:   35 LVOT Area:     2.84 cm  LV Volumes (MOD) LV vol d, MOD A2C: 99.3 ml LV vol d, MOD A4C: 72.3 ml LV vol s, MOD A2C: 37.9 ml LV vol s, MOD A4C: 28.5 ml LV SV MOD A2C:     61.4 ml LV SV MOD A4C:     72.3 ml LV SV MOD BP:      54.4 ml RIGHT  VENTRICLE             IVC RV Basal diam:  3.10 cm     IVC diam: 1.20 cm RV S prime:     10.20 cm/s TAPSE (M-mode): 2.2 cm LEFT ATRIUM             Index        RIGHT ATRIUM           Index LA diam:        3.00 cm 1.58 cm/m   RA Area:     14.60 cm LA Vol (A2C):   27.4 ml 14.43 ml/m  RA Volume:   39.20 ml  20.64 ml/m LA Vol (A4C):   27.1 ml 14.27 ml/m LA Biplane Vol: 28.7 ml 15.11 ml/m  AORTIC VALVE AV Area (Vmax):    2.29 cm AV Area (Vmean):   2.08 cm AV Area (VTI):     1.99 cm AV Vmax:           158.00 cm/s AV Vmean:          110.500 cm/s AV VTI:            0.338 m AV Peak Grad:      10.0 mmHg AV Mean Grad:      5.5 mmHg LVOT Vmax:         127.50 cm/s LVOT Vmean:        81.050 cm/s LVOT VTI:          0.238 m LVOT/AV VTI ratio: 0.70  AORTA Ao Root diam: 3.30 cm Ao Asc diam:  3.60 cm MITRAL VALVE               TRICUSPID VALVE MV Area (PHT): 3.89 cm    TR Peak grad:   25.0 mmHg MV Area VTI:   2.75 cm    TR Vmax:        250.00 cm/s MV Peak grad:  1.8 mmHg MV Mean grad:  1.0 mmHg    SHUNTS MV Vmax:       0.68 m/s    Systemic VTI:  0.24 m MV Vmean:      35.5 cm/s   Systemic Diam: 1.90 cm MV Decel Time: 195 msec MV E velocity: 60.70 cm/s MV A velocity: 46.70 cm/s MV E/A  ratio:  1.30 Dalton McleanMD Electronically signed by Wilfred Lacy Signature Date/Time: 05/21/2023/1:37:52 PM    Final      Assessment & Plan:   Dyspnea on exertion - Patient ingested hydraulic fluid at work around [facility] 2024.  After chemical exposure he developed shortness of breath, headaches and blurred vision.  He continues to have symptoms of persistent dyspnea with exertion along with fatigue symptoms.  He is currently under [employer]. Echocardiogram was normal. Allergy testing was positive for several allergens, IgE greater than 200.  FENO today 25. Unable to say if allergy testing is related to work exposure.  He has a tentative working diagnosis of allergic asthma.  Will start him on Singulair 10 mg at bedtime and as needed albuterol rescue inhaler to use every 6 hours as needed for breakthrough shortness of breath or wheezing.  He will need to reschedule pulmonary function testing that was canceled today to evaluate lung function and get HRCT of his chest to rule out interstitial lung disease or pneumonitis from chemical exposure. Ok to returned to work, recommend he limit exposure to chemicals or dusts, if unavoidable he will need to wear mask. FU after testing.  Frequent headaches - Awaiting neurology consult  40 mins spent on case; > 50% faced to face with patient  Glenford Bayley, NP 06/05/2023

## 2023-06-05 NOTE — Assessment & Plan Note (Signed)
Awaiting neurology consult.

## 2023-06-05 NOTE — Telephone Encounter (Signed)
These will need to be rescheduled if someone can rebook.  He was not aware of appointment and when Worker's Comp. called to confirm today's visit at 4 PM told that appointment was canceled due to technical issues

## 2023-06-05 NOTE — Patient Instructions (Addendum)
  At this time can not make a diagnosis as we need more testing. For now your diagnosis is dyspnea on exertion. Echocardiogram was normal. No concern today for heart condition causing shortness of breath. You allergy testing came back positive, IGE was elevated >200. Recommend starting medication called Singulair at bedtime used to treat allergies/asthma. We will need to get that high resolution CT of your chest ordered by Dr. Marchelle Gearing to look for any interstitial lung disease (pulmonary fibrosis) or pneumonitis caused by chemical exposure. We will also need to reschedule breathing test to assess lung function and evaluate for asthma. Potential working diagnosis of allergic asthma.   Recommendations: - Start Singulair 10mg  at bedtime  - Albuterol hfa 2 puffs every 6 hours for shortness of breath/chest tightness   Follow-up:  - After HRCT and PFTs to review results with Dr. Marchelle Gearing

## 2023-06-05 NOTE — Assessment & Plan Note (Addendum)
-   Patient ingested hydraulic fluid at work around [facility] 2024.  After chemical exposure he developed shortness of breath, headaches and blurred vision.  He continues to have symptoms of persistent dyspnea with exertion along with fatigue symptoms.  He is currently under [employer]. Echocardiogram was normal. Allergy testing was positive for several allergens, IgE greater than 200.  FENO today 25. Unable to say if allergy testing is related to work exposure.  He has a tentative working diagnosis of allergic asthma.  Will start him on Singulair 10 mg at bedtime and as needed albuterol rescue inhaler to use every 6 hours as needed for breakthrough shortness of breath or wheezing.  He will need to reschedule pulmonary function testing that was canceled today to evaluate lung function and get HRCT of his chest to rule out interstitial lung disease or pneumonitis from chemical exposure. Ok to returned to work, recommend he limit exposure to chemicals or dusts, if unavoidable he will need to wear mask. FU after testing.

## 2023-06-05 NOTE — Telephone Encounter (Signed)
Can you update me on HRCT that was ordered, this does not appear to have been scheduled

## 2023-06-06 ENCOUNTER — Telehealth: Payer: Self-pay | Admitting: Primary Care

## 2023-06-06 NOTE — Telephone Encounter (Signed)
Patient states needs letter for restrictions. Patient phone number is [phone].

## 2023-06-06 NOTE — Telephone Encounter (Signed)
Pt was scheduled for CT on 7/5 and was a "no show".  Johny Drilling attempted to call him with appt info and left him a detailed vm with appt info and mailed him a letter with appt info.

## 2023-06-06 NOTE — Telephone Encounter (Signed)
I received a call form Jennette Dubin and have faxed the office notes from 06/05/23 to fax# [phone]

## 2023-06-06 NOTE — Telephone Encounter (Signed)
Per request from Buelah Manis, NP I have faxed 05/21/23 echocardiogram results to patient's employer [employer]-attn: Jennette Dubin fax# [phone] and [employer]-attn: Rexene Agent fax# [phone]

## 2023-06-06 NOTE — Telephone Encounter (Signed)
I saw him yesterday in clinic, can we try and re-schedule or reach out to him

## 2023-06-06 NOTE — Telephone Encounter (Signed)
I called the patient and transferred him over to [place] at [facility] to schedule

## 2023-06-07 NOTE — Telephone Encounter (Signed)
Closing encounter. NFN 

## 2023-06-07 NOTE — Telephone Encounter (Signed)
Ok, thanks.

## 2023-06-07 NOTE — Telephone Encounter (Signed)
Beth just wanted you to know I have patient scheduled for the next available pft- its not until [employer]

## 2023-06-08 NOTE — Telephone Encounter (Signed)
Closing encounter since results have been faxed to case manager

## 2023-06-11 ENCOUNTER — Ambulatory Visit (HOSPITAL_COMMUNITY)
Admission: RE | Admit: 2023-06-11 | Discharge: 2023-06-11 | Disposition: A | Discharge status: Home / Self Care | Payer: BC Managed Care – PPO | Source: Ambulatory Visit | Attending: Internal Medicine | Admitting: Internal Medicine

## 2023-06-11 DIAGNOSIS — R0609 Other forms of dyspnea: Secondary | ICD-10-CM | POA: Insufficient documentation

## 2023-06-11 MED ORDER — ALBUTEROL SULFATE (2.5 MG/3ML) 0.083% IN NEBU
2.5000 mg | INHALATION_SOLUTION | Freq: Once | RESPIRATORY_TRACT | Status: AC
  Administered 2023-06-11: 2.5 mg via RESPIRATORY_TRACT

## 2023-06-13 LAB — PULMONARY FUNCTION TEST
DL/VA % pred: 126 %
DL/VA: 5.56 ml/min/mmHg/L
DLCO unc % pred: 82 %
DLCO unc: 22.45 ml/min/mmHg
FEF 25-75 Post: 1.34 L/sec
FEF2575-%Pred-Post: 42 %
FEV1-%Pred-Post: 48 %
FEV1-%Pred-Pre: 44 %
FEV1-Post: 1.74 L
FEV1FVC-%Change-Post: -2 %
FEV1FVC-%Pred-Pre: 85 %
FEV6-%Change-Post: 14 %
FEV6-%Pred-Post: 60 %
FEV6-%Pred-Pre: 52 %
FEV6-Post: 2.7 L
FEV6FVC-%Change-Post: 1 %
FEV6FVC-%Pred-Post: 102 %
FEV6FVC-%Pred-Pre: 101 %
FVC-%Change-Post: 12 %
FVC-%Pred-Post: 58 %
FVC-Post: 2.73 L
FVC-Pre: 2.42 L
Post FEV1/FVC ratio: 64 %
Post FEV6/FVC ratio: 99 %
Pre FEV1/FVC ratio: 66 %
Pre FEV6/FVC Ratio: 98 %

## 2023-06-13 NOTE — Telephone Encounter (Signed)
I believe we gave him a letter at his last visit, no changes. Can you look up letter and print for me to review. I said he could return to work, needed to wear mask if going to be exposed to dust/chemicals

## 2023-06-13 NOTE — Telephone Encounter (Signed)
Pt requesting letter for restrictions.Beth are you okay with this. Pt seen you last, and if so what are the restrictions

## 2023-06-13 NOTE — Telephone Encounter (Signed)
Letter was completed 06/05/23 by Beth,NP with recommended return to work and restrictions. Patient has been called and made aware letter is ready.  Letter is placed at front desk for pick up.  Nothing further at this time.

## 2023-06-16 NOTE — Progress Notes (Deleted)
@Patient  ID: Gregory Byrd, male    DOB: Jul 31, 1971, 52 y.o.   MRN: [id number]  No chief complaint on file.   Referring provider: No ref. provider found  HPI: 52 year old male, never smoked. No prior significant past medical history.   Previous [facility] pulmonary encounter: 04/26/2023 -  Consult. Dr. Marchelle Gearing  Chief Complaint  Patient presents with   Consult    Workers comp.  On forklift, hydrolic lines exploded. Patient got hydrolic fluid splashed in mouth and in face.  Caused headaches and blurred vision.  Incident happened 6 months ago   Gregory Byrd 52 y.o. 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  They went to the main manufactures for hydraulic problems that is seen in car trunks and chairs.  He commutes to [place] and come back to [place].  He joined this job in October 2023.  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  His wife is Interior and spatial designer of HR at [employer].  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  He was wearing goggles but it got into his eyes and mouth.  He then wiped it off.  But then some 30-60 minutes later he started feeling short of breath.  He then went outside to get some fresh air.  Later that night he started having headaches.  Then 2 to 3 days status started having blurred vision.  A few weeks later started developing dry cough along with wheezing.  All the symptoms are persisting.  In fact the blurred vision is worse.  He continues to have headaches.  He is also having fatigue.  His shortness of breath is present particularly with exertion when he climbs 2 flights of stairs at home.  His cough is ongoing.  He says the day after the incident he did seek medical help and was seen by a physician here in [place] and was advised to go to the  emergency room.  His work as a strict 90-day note time off policy.  Therefore he is "fought through" and then only after his 90-day.  He started seekubg medical help in detail only now.  Unclear to me if he seen a neurologist  He does not have a prior history of asthma.  There is no cat in the house no dogs.  No pet birds no mildew no mold.  No down pillow.  No tobacco use no marijuana use no cocaine use.  He says he lives in a good newer home.   Sit/stand exercise hypoxemia test.  His resting pulse ox was 97%.  After sitting and standing 10 times his lowest pulse ox was again only 97%.  Okay he did not get dyspneic but felt mildly dizzy.  He did have a chest x-ray May 2024.  Personally visualized.  Agree it is clear.   06/05/23- interim Clent Ridges, NP Patient presents today for 1 month follow-up.  Worker's Comp. present. He ingested hydrolic fluid at work around [facility] 2024. He develop breathing issues, headaches and blurred vision occurred shortly after exposure. He has had persistent dyspnea symptoms, cough and headaches since exposure. He experiences fatigue symptoms climbing stairs and walking long distances. Cough is not productive, needs to clear his throat because of mucus often. Symptoms can fluctuate.  He works at [employer]. 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   He was not aware he had a breathing test scheduled for today. Workers compensation called to confirm 4 oclock apt and was told that his 10am PFTs was cancelled due to tech difficulty. PFTs will need to be rescheduled. He has not had HRCT imaging done, this has not been scheduled. Echocardiogram was normal. FENO today was 25.   No Known Allergies   There is no immunization history on file for this patient.  No past medical history on file.  Tobacco History: Social History   Tobacco Use  Smoking Status Never   Smokeless Tobacco Never   Counseling given: Not Answered   Outpatient Medications Prior to Visit  Medication Sig Dispense Refill   albuterol (VENTOLIN HFA) 108 (90 Base) MCG/ACT inhaler Inhale 2 puffs into the lungs every 6 (six) hours as needed for wheezing or shortness of breath. 8 g 2   gabapentin (NEURONTIN) 300 MG capsule Take 300 mg by mouth at bedtime as needed.     predniSONE (DELTASONE) 10 MG tablet Take 10 mg by mouth. Take as directed     No facility-administered medications prior to visit.   Review of Systems  Review of Systems  Constitutional:  Positive for fatigue.  HENT: Negative.    Respiratory:  Positive for cough and shortness of breath.   Cardiovascular: Negative.   Neurological:  Positive for headaches.   Physical Exam  There were no vitals taken for this visit. Physical Exam Constitutional:      General: He is not in acute distress.    Appearance: Normal appearance. He is not ill-appearing.  HENT:     Head: Normocephalic and atraumatic.     Mouth/Throat:     Mouth: Mucous membranes are moist.     Pharynx: Oropharynx is clear.  Cardiovascular:     Rate and Rhythm: Normal rate and regular rhythm.  Pulmonary:     Effort: Pulmonary effort is normal.     Breath sounds: Normal breath sounds.     Comments: CTA Musculoskeletal:        General: Normal range of motion.  Skin:    General: Skin is warm and dry.  Neurological:     General: No focal deficit present.     Mental Status: He is alert and oriented to person, place, and time. Mental status is at baseline.  Psychiatric:        Mood and Affect: Mood normal.        Behavior: Behavior normal.        Thought Content: Thought content normal.        Judgment: Judgment normal.      Lab Results:  CBC    Component Value Date/Time   WBC 4.4 04/26/2023 0922   RBC 4.92 04/26/2023 0922   HGB 13.6 04/26/2023 0922   HCT 41.8 04/26/2023 0922   PLT 198.0 04/26/2023 0922   MCV 84.9 04/26/2023 0922    MCH 28.2 04/09/2023 2139   MCHC 32.5 04/26/2023 0922   RDW 14.5 04/26/2023 0922   LYMPHSABS 1.4 04/26/2023 0922   MONOABS 0.4 04/26/2023 0922   EOSABS 0.1 04/26/2023 0922   BASOSABS 0.0 04/26/2023 0922    BMET    Component Value Date/Time   NA 138 04/09/2023 2139   K 3.8 04/09/2023 2139  CL 105 04/09/2023 2139   CO2 24 04/09/2023 2139   GLUCOSE 121 (H) 04/09/2023 2139   BUN 12 04/09/2023 2139   CREATININE 1.14 04/09/2023 2139   CALCIUM 8.8 (L) 04/09/2023 2139   GFRNONAA >60 04/09/2023 2139   GFRAA >60 06/16/2011 1215    BNP No results found for: "BNP"  ProBNP No results found for: "PROBNP"  Imaging: MR KNEE RIGHT WO CONTRAST  Result Date: 06/04/2023 CLINICAL DATA:  Right knee pain after fall a month ago. EXAM: MRI OF THE RIGHT KNEE WITHOUT CONTRAST TECHNIQUE: Multiplanar, multisequence MR imaging of the right was performed. No intravenous contrast was administered. COMPARISON:  Radiographs dated March 08, 2005 FINDINGS: MENISCI Medial: Degeneration/diminution of the body. Lateral: Intact. LIGAMENTS Cruciates: ACL and PCL are intact. Collaterals: Medial collateral ligament is intact. Lateral collateral ligament complex is intact. CARTILAGE Patellofemoral: Focal full-thickness defect at the patellar apex and medial patellar facet. Articular cartilage thinning prominent of the lateral trochlea. Medial: Full-thickness cartilage defect at the weight-bearing area with subchondral edema of the medial femoral condyle. Lateral:  No chondral defect. JOINT: Small knee joint effusion. Normal Hoffa's fat-pad. No plical thickening. POPLITEAL FOSSA: Popliteus tendon is intact. No Baker's cyst. EXTENSOR MECHANISM: Intact quadriceps tendon. Intact patellar tendon. Intact lateral patellar retinaculum. Intact medial patellar retinaculum. Intact MPFL. BONES: No aggressive osseous lesion. No fracture or dislocation. Small marginal osteophytes. Other: No fluid collection or hematoma. Muscles are normal.  IMPRESSION: 1. 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 2. 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 3. Cruciate and collateral ligaments are intact. Quadriceps tendon and patellar tendon are also intact. 4. No evidence of fracture or osteonecrosis. Electronically Signed   By: Larose Hires D.O.   On: 06/04/2023 11:25   ECHOCARDIOGRAM COMPLETE  Result Date: 05/21/2023    ECHOCARDIOGRAM REPORT   Patient Name:   DONLD LUEBBE Date of Exam: 05/21/2023 Medical Rec #:  [id number]         Height:       69.0 in Accession #:    [id number]        Weight:       164.0 lb Date of Birth:  06-07-1971         BSA:          1.899 m Patient Age:    52 years          BP:           154/64 mmHg Patient Gender: M                 HR:           52 bpm. Exam Location:  Outpatient Procedure: 2D Echo, Cardiac Doppler and Color Doppler Indications:    SOB  History:        Patient has no prior history of Echocardiogram examinations.                 Signs/Symptoms:Shortness of Breath and Dyspnea. History of                 chemical exposure 03/2023.  Sonographer:    Wallie Char Referring Phys: [id number] MURALI RAMASWAMY IMPRESSIONS  1. Left ventricular ejection fraction, by estimation, is 60 to 65%. The left ventricle has normal function. The left ventricle has no regional wall motion abnormalities. Left ventricular diastolic parameters were normal.  2. Right ventricular systolic function is normal. The right  ventricular size is normal. There is normal pulmonary artery systolic pressure. The estimated right ventricular systolic pressure is 28.0 mmHg.  3. The mitral valve is normal in structure. No evidence of mitral valve regurgitation. No evidence of mitral stenosis.  4. The aortic valve is tricuspid. Aortic valve regurgitation is not visualized. No aortic stenosis is  present.  5. The inferior vena cava is normal in size with greater than 50% respiratory variability, suggesting right atrial pressure of 3 mmHg. FINDINGS  Left Ventricle: Left ventricular ejection fraction, by estimation, is 60 to 65%. The left ventricle has normal function. The left ventricle has no regional wall motion abnormalities. The left ventricular internal cavity size was normal in size. There is  no left ventricular hypertrophy. Left ventricular diastolic parameters were normal. Right Ventricle: The right ventricular size is normal. No increase in right ventricular wall thickness. Right ventricular systolic function is normal. There is normal pulmonary artery systolic pressure. The tricuspid regurgitant velocity is 2.50 m/s, and  with an assumed right atrial pressure of 3 mmHg, the estimated right ventricular systolic pressure is 28.0 mmHg. Left Atrium: Left atrial size was normal in size. Right Atrium: Right atrial size was normal in size. Pericardium: There is no evidence of pericardial effusion. Mitral Valve: The mitral valve is normal in structure. No evidence of mitral valve regurgitation. No evidence of mitral valve stenosis. MV peak gradient, 1.8 mmHg. The mean mitral valve gradient is 1.0 mmHg. Tricuspid Valve: The tricuspid valve is normal in structure. Tricuspid valve regurgitation is trivial. Aortic Valve: The aortic valve is tricuspid. Aortic valve regurgitation is not visualized. No aortic stenosis is present. Aortic valve mean gradient measures 5.5 mmHg. Aortic valve peak gradient measures 10.0 mmHg. Aortic valve area, by VTI measures 1.99  cm. Pulmonic Valve: The pulmonic valve was normal in structure. Pulmonic valve regurgitation is not visualized. Aorta: The aortic root is normal in size and structure. Venous: The inferior vena cava is normal in size with greater than 50% respiratory variability, suggesting right atrial pressure of 3 mmHg. IAS/Shunts: No atrial level shunt detected by  color flow Doppler.  LEFT VENTRICLE PLAX 2D LVIDd:         4.50 cm     Diastology LVIDs:         3.10 cm     LV e' medial:    7.35 cm/s LV PW:         0.90 cm     LV E/e' medial:  8.3 LV IVS:        1.10 cm     LV e' lateral:   12.40 cm/s LVOT diam:     1.90 cm     LV E/e' lateral: 4.9 LV SV:         67 LV SV Index:   35 LVOT Area:     2.84 cm  LV Volumes (MOD) LV vol d, MOD A2C: 99.3 ml LV vol d, MOD A4C: 72.3 ml LV vol s, MOD A2C: 37.9 ml LV vol s, MOD A4C: 28.5 ml LV SV MOD A2C:     61.4 ml LV SV MOD A4C:     72.3 ml LV SV MOD BP:      54.4 ml RIGHT VENTRICLE             IVC RV Basal diam:  3.10 cm     IVC diam: 1.20 cm RV S prime:     10.20 cm/s TAPSE (M-mode): 2.2 cm LEFT ATRIUM  Index        RIGHT ATRIUM           Index LA diam:        3.00 cm 1.58 cm/m   RA Area:     14.60 cm LA Vol (A2C):   27.4 ml 14.43 ml/m  RA Volume:   39.20 ml  20.64 ml/m LA Vol (A4C):   27.1 ml 14.27 ml/m LA Biplane Vol: 28.7 ml 15.11 ml/m  AORTIC VALVE AV Area (Vmax):    2.29 cm AV Area (Vmean):   2.08 cm AV Area (VTI):     1.99 cm AV Vmax:           158.00 cm/s AV Vmean:          110.500 cm/s AV VTI:            0.338 m AV Peak Grad:      10.0 mmHg AV Mean Grad:      5.5 mmHg LVOT Vmax:         127.50 cm/s LVOT Vmean:        81.050 cm/s LVOT VTI:          0.238 m LVOT/AV VTI ratio: 0.70  AORTA Ao Root diam: 3.30 cm Ao Asc diam:  3.60 cm MITRAL VALVE               TRICUSPID VALVE MV Area (PHT): 3.89 cm    TR Peak grad:   25.0 mmHg MV Area VTI:   2.75 cm    TR Vmax:        250.00 cm/s MV Peak grad:  1.8 mmHg MV Mean grad:  1.0 mmHg    SHUNTS MV Vmax:       0.68 m/s    Systemic VTI:  0.24 m MV Vmean:      35.5 cm/s   Systemic Diam: 1.90 cm MV Decel Time: 195 msec MV E velocity: 60.70 cm/s MV A velocity: 46.70 cm/s MV E/A ratio:  1.30 Dalton McleanMD Electronically signed by Wilfred Lacy Signature Date/Time: 05/21/2023/1:37:52 PM    Final      Assessment & Plan:   No problem-specific Assessment & Plan notes  found for this encounter.   40 mins spent on case; > 50% faced to face with patient  Jetty Duhamel, MD 06/16/2023

## 2023-06-18 ENCOUNTER — Ambulatory Visit: Payer: BC Managed Care – PPO | Admitting: Internal Medicine

## 2023-06-18 ENCOUNTER — Ambulatory Visit (INDEPENDENT_AMBULATORY_CARE_PROVIDER_SITE_OTHER): Payer: BC Managed Care – PPO | Admitting: Adult Health

## 2023-06-18 ENCOUNTER — Encounter: Payer: Self-pay | Admitting: Adult Health

## 2023-06-18 VITALS — BP 120/72 | HR 66 | Ht 68.0 in | Wt 168.0 lb

## 2023-06-18 DIAGNOSIS — J4551 Severe persistent asthma with (acute) exacerbation: Secondary | ICD-10-CM | POA: Diagnosis not present

## 2023-06-18 DIAGNOSIS — J45909 Unspecified asthma, uncomplicated: Secondary | ICD-10-CM | POA: Insufficient documentation

## 2023-06-18 MED ORDER — TRELEGY ELLIPTA 100-62.5-25 MCG/ACT IN AEPB: 1.0000 | INHALATION_SPRAY | Freq: Every day | RESPIRATORY_TRACT | 5 refills | Status: AC

## 2023-06-18 MED ORDER — PREDNISONE 10 MG PO TABS: ORAL_TABLET | ORAL | 0 refills | Status: AC

## 2023-06-18 NOTE — Patient Instructions (Addendum)
Prednisone taper over next week.  Begin Trelegy 1 puff daily , rinse after use  Albuterol inhaler As needed   Begin Zyrtec 10mg  At bedtime.  Go for HRCT chest next week.  Out of work for 2 weeks.  Follow up with Dr. Marchelle Gearing or Raegen Tarpley NP in 2 weeks and As needed   Please contact office for sooner follow up if symptoms do not improve or worsen or seek emergency care

## 2023-06-18 NOTE — Assessment & Plan Note (Addendum)
Patient with 6 months of symptoms of shortness of breath, chest tightness and cough.  The symptoms occurred after exposure to hydraulic fluid at work.  No previous diagnosis of asthma.  PFTs done on June 11, 2023 showed severe airflow obstruction with mid flow reversibility consistent with underlying asthma-currently having exacerbation.  Will treat with a prednisone taper.  Begin triple therapy maintenance inhaler.  Control for triggers add in Zyrtec daily.  Follow-up for high-resolution CT chest next as planned Asthma action plan discussed in detail   Plan  Patient Instructions  Prednisone taper over next week.  Begin Trelegy 1 puff daily , rinse after use  Albuterol inhaler As needed   Begin Zyrtec 10mg  At bedtime.  Go for HRCT chest next week.  Out of work for 2 weeks.  Follow up with Dr. Marchelle Gearing or Montez Stryker NP in 2 weeks and As needed   Please contact office for sooner follow up if symptoms do not improve or worsen or seek emergency care

## 2023-06-18 NOTE — Progress Notes (Signed)
@Patient  ID: Gregory Byrd, male    DOB: [birth year], 52 y.o.   MRN: [id number]     Referring provider: No ref. provider found  HPI: 52 year old male never smoker seen for pulmonary consult April 26, 2023 for shortness of breath.  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  Started Job in  October 2023. 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 He was wearing goggles but it got into his eyes and mouth. He then wiped it off. But then some 30-60 minutes later he started feeling short of breath. He then went outside to get some fresh air. Later that night he started having headaches. Then 2 to 3 days status started having blurred vision. A few weeks later started developing dry cough along with wheezing. Persistent symptoms .     TEST  2D echo done on May 21, 2023 showed normal EF, normal pulmonary artery systolic pressure.  No valvular disease     06/18/2023 Follow up : Dyspnea Gregory Byrd comp case  Patient presents for a 2-week follow-up.  Patient has had 6 months of intermittent episodes of shortness of breath chest tightness.  Symptoms started after above exposure to hydraulic fluid at his workplace.  He has no previous diagnosis of asthma.  Says he is always been in very good shape never had any known breathing problems.  He played college sports at [employer].  He played baseball.  Says he is a runner.  No known history of COVID-19 infection.  Says that he has been on light duty at work.  Recently working in an area with lots of dust and paint fumes over the last week his breathing has not been as good.  He has had intermittent cough, wheezing and tightness.  Patient has not been able to go to work for the last 2  days. PFTs done on June 11, 2023 show severe airflow obstruction with mid flow reversibility with FEV1 at 44%, ratio 66, FVC 52%, diffusing capacity 82%.  Patient has an upcoming high-resolution CT chest pending for next week. Fino testing today is elevated at 27ppb      No Known Allergies     There is no immunization history on file for this patient.  No past medical history on file.  Tobacco History: Social History   Tobacco Use  Smoking Status Never  Smokeless Tobacco Never   Counseling given: Not Answered   Outpatient Medications Prior to Visit  Medication Sig Dispense Refill   albuterol (VENTOLIN HFA) 108 (90 Base) MCG/ACT inhaler Inhale 2 puffs into the lungs every 6 (six) hours as needed for wheezing or shortness of breath. 8 g 2   gabapentin (NEURONTIN) 300 MG capsule Take 300 mg by mouth at bedtime as needed.     predniSONE (DELTASONE) 10 MG tablet Take 10 mg by mouth. Take as directed     No facility-administered medications prior to visit.     Review of Systems:   Constitutional:   No  weight loss, night sweats,  Fevers, chills, fatigue, or  lassitude.  HEENT:   No headaches,  Difficulty swallowing,  Tooth/dental problems, or  Sore throat,  No sneezing, itching, ear ache,  +nasal congestion, post nasal drip,   CV:  No chest pain,  Orthopnea, PND, swelling in lower extremities, anasarca, dizziness, palpitations, syncope.   GI  No heartburn, indigestion, abdominal pain, nausea, vomiting, diarrhea, change in bowel habits, loss of appetite, bloody stools.   Resp: .  No chest wall deformity  Skin: no rash or lesions.  GU: no dysuria, change in color of urine, no urgency or frequency.  No flank pain, no hematuria   MS:  No joint pain or swelling.  No decreased range of motion.  No back pain.    Physical Exam  BP 120/72   Pulse 66   Ht 5\' 8"  (1.727 m)   Wt 168 lb (76.2 kg)   SpO2 98%   BMI 25.54 kg/m   GEN: A/Ox3; pleasant , NAD,  well nourished    HEENT:  [place]/AT,  EACs-clear, TMs-wnl, NOSE-clear, THROAT-clear, no lesions, no postnasal drip or exudate noted.   NECK:  Supple w/ fair ROM; no JVD; normal carotid impulses w/o bruits; no thyromegaly or nodules palpated; no lymphadenopathy.    RESP few expiratory wheezes, speaks in full sentences asthma no accessory muscle use, no dullness to percussion  CARD:  RRR, no m/r/g, no peripheral edema, pulses intact, no cyanosis or clubbing.  GI:   Soft & nt; nml bowel sounds; no organomegaly or masses detected.   Musco: Warm bil, no deformities or joint swelling noted.   Neuro: alert, no focal deficits noted.    Skin: Warm, no lesions or rashes    Lab Results:  CBC    Component Value Date/Time   WBC 4.4 04/26/2023 0922   RBC 4.92 04/26/2023 0922   HGB 13.6 04/26/2023 0922   HCT 41.8 04/26/2023 0922   PLT 198.0 04/26/2023 0922   MCV 84.9 04/26/2023 0922   MCH 28.2 04/09/2023 2139   MCHC 32.5 04/26/2023 0922   RDW 14.5 04/26/2023 0922   LYMPHSABS 1.4 04/26/2023 0922   MONOABS 0.4 04/26/2023 0922   EOSABS 0.1 04/26/2023 0922   BASOSABS 0.0 04/26/2023 0922    BMET    Component Value Date/Time   NA 138 04/09/2023 2139   K 3.8 04/09/2023 2139   CL 105 04/09/2023 2139   CO2 24 04/09/2023 2139   GLUCOSE 121 (H) 04/09/2023 2139   BUN 12 04/09/2023 2139   CREATININE 1.14 04/09/2023 2139   CALCIUM 8.8 (L) 04/09/2023 2139   GFRNONAA >60 04/09/2023 2139   GFRAA >60 06/16/2011 1215    BNP No results found for: "BNP"  ProBNP No results found for: "PROBNP"  Imaging: MR KNEE RIGHT WO CONTRAST  Result Date: 06/04/2023 CLINICAL DATA:  Right knee pain after fall a month ago. EXAM: MRI OF THE RIGHT KNEE WITHOUT CONTRAST TECHNIQUE: Multiplanar, multisequence MR imaging of the right was performed. No intravenous contrast was administered. COMPARISON:  Radiographs dated March 08, 2005 FINDINGS: MENISCI Medial: Degeneration/diminution of the body. Lateral: Intact.  LIGAMENTS Cruciates: ACL and PCL are intact. Collaterals: Medial collateral ligament is intact. Lateral collateral ligament complex is intact. CARTILAGE Patellofemoral: Focal full-thickness defect at the patellar apex and medial patellar facet. Articular cartilage thinning prominent of the lateral trochlea. Medial: Full-thickness cartilage defect at the weight-bearing area with subchondral edema of the medial femoral condyle. Lateral:  No chondral defect. JOINT: Small knee joint effusion. Normal Hoffa's fat-pad. No plical thickening. POPLITEAL FOSSA: Popliteus tendon is intact. No Baker's cyst. EXTENSOR MECHANISM: Intact quadriceps tendon. Intact patellar tendon. Intact lateral patellar  retinaculum. Intact medial patellar retinaculum. Intact MPFL. BONES: No aggressive osseous lesion. No fracture or dislocation. Small marginal osteophytes. Other: No fluid collection or hematoma. Muscles are normal. IMPRESSION: 1. 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 2. 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 3. Cruciate and collateral ligaments are intact. Quadriceps tendon and patellar tendon are also intact. 4. No evidence of fracture or osteonecrosis. Electronically Signed   By: Larose Hires D.O.   On: 06/04/2023 11:25   ECHOCARDIOGRAM COMPLETE  Result Date: 05/21/2023    ECHOCARDIOGRAM REPORT   Patient Name:   Gregory Byrd Date of Exam: 05/21/2023 Medical Rec #:  [id number]         Height:       69.0 in Accession #:    [id number]        Weight:       164.0 lb Date of Birth:  1970/12/12         BSA:          1.899 m Patient Age:    52 years          BP:           154/64 mmHg Patient Gender: M                 HR:           52 bpm. Exam Location:  Outpatient Procedure: 2D Echo, Cardiac Doppler and Color Doppler Indications:    SOB  History:         Patient has no prior history of Echocardiogram examinations.                 Signs/Symptoms:Shortness of Breath and Dyspnea. History of                 chemical exposure 03/2023.  Sonographer:    Wallie Char Referring Phys: [id number] MURALI RAMASWAMY IMPRESSIONS  1. Left ventricular ejection fraction, by estimation, is 60 to 65%. The left ventricle has normal function. The left ventricle has no regional wall motion abnormalities. Left ventricular diastolic parameters were normal.  2. Right ventricular systolic function is normal. The right ventricular size is normal. There is normal pulmonary artery systolic pressure. The estimated right ventricular systolic pressure is 28.0 mmHg.  3. The mitral valve is normal in structure. No evidence of mitral valve regurgitation. No evidence of mitral stenosis.  4. The aortic valve is tricuspid. Aortic valve regurgitation is not visualized. No aortic stenosis is present.  5. The inferior vena cava is normal in size with greater than 50% respiratory variability, suggesting right atrial pressure of 3 mmHg. FINDINGS  Left Ventricle: Left ventricular ejection fraction, by estimation, is 60 to 65%. The left ventricle has normal function. The left ventricle has no regional wall motion abnormalities. The left ventricular internal cavity size was normal in size. There is  no left ventricular hypertrophy. Left ventricular diastolic parameters were normal. Right Ventricle: The right ventricular size is normal. No increase in right ventricular wall thickness. Right ventricular systolic function is normal. There is normal pulmonary artery systolic pressure. The tricuspid regurgitant velocity is 2.50 m/s, and  with an assumed right atrial pressure of 3 mmHg, the estimated right ventricular systolic pressure is 28.0 mmHg. Left Atrium: Left atrial size was normal in size. Right Atrium: Right atrial size was normal in size. Pericardium: There is no evidence of pericardial effusion. Mitral Valve:  The mitral valve is normal in structure. No evidence of mitral valve regurgitation. No evidence of mitral valve stenosis. MV peak gradient, 1.8 mmHg. The mean mitral valve gradient is 1.0 mmHg. Tricuspid Valve: The tricuspid valve is normal in structure. Tricuspid valve regurgitation is trivial. Aortic Valve: The aortic valve is tricuspid. Aortic valve regurgitation is not visualized. No aortic stenosis is present. Aortic valve mean gradient measures 5.5 mmHg. Aortic valve peak gradient measures 10.0 mmHg. Aortic valve area, by VTI measures 1.99  cm. Pulmonic Valve: The pulmonic valve was normal in structure. Pulmonic valve regurgitation is not visualized. Aorta: The aortic root is normal in size and structure. Venous: The inferior vena cava is normal in size with greater than 50% respiratory variability, suggesting right atrial pressure of 3 mmHg. IAS/Shunts: No atrial level shunt detected by color flow Doppler.  LEFT VENTRICLE PLAX 2D LVIDd:         4.50 cm     Diastology LVIDs:         3.10 cm     LV e' medial:    7.35 cm/s LV PW:         0.90 cm     LV E/e' medial:  8.3 LV IVS:        1.10 cm     LV e' lateral:   12.40 cm/s LVOT diam:     1.90 cm     LV E/e' lateral: 4.9 LV SV:         67 LV SV Index:   35 LVOT Area:     2.84 cm  LV Volumes (MOD) LV vol d, MOD A2C: 99.3 ml LV vol d, MOD A4C: 72.3 ml LV vol s, MOD A2C: 37.9 ml LV vol s, MOD A4C: 28.5 ml LV SV MOD A2C:     61.4 ml LV SV MOD A4C:     72.3 ml LV SV MOD BP:      54.4 ml RIGHT VENTRICLE             IVC RV Basal diam:  3.10 cm     IVC diam: 1.20 cm RV S prime:     10.20 cm/s TAPSE (M-mode): 2.2 cm LEFT ATRIUM             Index        RIGHT ATRIUM           Index LA diam:        3.00 cm 1.58 cm/m   RA Area:     14.60 cm LA Vol (A2C):   27.4 ml 14.43 ml/m  RA Volume:   39.20 ml  20.64 ml/m LA Vol (A4C):   27.1 ml 14.27 ml/m LA Biplane Vol: 28.7 ml 15.11 ml/m  AORTIC VALVE AV Area (Vmax):    2.29 cm AV Area (Vmean):   2.08 cm AV Area (VTI):      1.99 cm AV Vmax:           158.00 cm/s AV Vmean:          110.500 cm/s AV VTI:            0.338 m AV Peak Grad:      10.0 mmHg AV Mean Grad:      5.5 mmHg LVOT Vmax:         127.50 cm/s LVOT Vmean:        81.050 cm/s LVOT VTI:          0.238 m LVOT/AV VTI ratio: 0.70  AORTA Ao Root diam: 3.30 cm Ao Asc diam:  3.60 cm MITRAL VALVE               TRICUSPID VALVE MV Area (PHT): 3.89 cm    TR Peak grad:   25.0 mmHg MV Area VTI:   2.75 cm    TR Vmax:        250.00 cm/s MV Peak grad:  1.8 mmHg MV Mean grad:  1.0 mmHg    SHUNTS MV Vmax:       0.68 m/s    Systemic VTI:  0.24 m MV Vmean:      35.5 cm/s   Systemic Diam: 1.90 cm MV Decel Time: 195 msec MV E velocity: 60.70 cm/s MV A velocity: 46.70 cm/s MV E/A ratio:  1.30 Dalton McleanMD Electronically signed by Wilfred Lacy Signature Date/Time: 05/21/2023/1:37:52 PM    Final     Administration History     None          Latest Ref Rng & Units 06/11/2023   11:08 AM  PFT Results  FVC-Pre L 2.42   FVC-Predicted Pre % 52   FVC-Post L 2.73   FVC-Predicted Post % 58   Pre FEV1/FVC % % 66   Post FEV1/FCV % % 64   FEV1-Pre L 1.59   FEV1-Predicted Pre % 44   FEV1-Post L 1.74   DLCO uncorrected ml/min/mmHg 22.45   DLCO UNC% % 82   DLVA Predicted % 126     No results found for: "NITRICOXIDE"      Assessment & Plan:   No problem-specific Assessment & Plan notes found for this encounter.     Rubye Oaks, NP 06/18/2023

## 2023-06-19 ENCOUNTER — Other Ambulatory Visit: Payer: Self-pay

## 2023-06-19 ENCOUNTER — Telehealth: Payer: Self-pay | Admitting: Adult Health

## 2023-06-19 DIAGNOSIS — M25531 Pain in right wrist: Secondary | ICD-10-CM

## 2023-06-19 DIAGNOSIS — S66911A Strain of unspecified muscle, fascia and tendon at wrist and hand level, right hand, initial encounter: Secondary | ICD-10-CM

## 2023-06-19 DIAGNOSIS — M25561 Pain in right knee: Secondary | ICD-10-CM

## 2023-06-19 NOTE — Telephone Encounter (Signed)
I received an email from Jennette Dubin, RN - Case Manager at [facility] requesting the PFT results and office notes from 7/29 for continued evaluation of the patient's workmen's comp claim.  I have faxed the documents to fax# [phone].

## 2023-06-26 ENCOUNTER — Other Ambulatory Visit: Payer: Self-pay

## 2023-06-29 ENCOUNTER — Telehealth: Payer: Self-pay | Admitting: Internal Medicine

## 2023-06-29 NOTE — Telephone Encounter (Signed)
Extend note till 07/06/23. Can sign next week

## 2023-06-29 NOTE — Telephone Encounter (Signed)
Pt. Calling has apt. With Dr. On 8.13..24 but needs the work note to  hold him till then because he works out of town the note now is only good to go back 8.12.24 please advise pt.

## 2023-07-03 ENCOUNTER — Ambulatory Visit (INDEPENDENT_AMBULATORY_CARE_PROVIDER_SITE_OTHER): Payer: BC Managed Care – PPO | Admitting: Internal Medicine

## 2023-07-03 ENCOUNTER — Encounter: Payer: Self-pay | Admitting: Internal Medicine

## 2023-07-03 VITALS — BP 139/90 | HR 61 | Ht 68.0 in | Wt 169.8 lb

## 2023-07-03 DIAGNOSIS — J683 Other acute and subacute respiratory conditions due to chemicals, gases, fumes and vapors: Secondary | ICD-10-CM | POA: Diagnosis not present

## 2023-07-03 DIAGNOSIS — Z9109 Other allergy status, other than to drugs and biological substances: Secondary | ICD-10-CM

## 2023-07-03 DIAGNOSIS — Z87898 Personal history of other specified conditions: Secondary | ICD-10-CM

## 2023-07-03 DIAGNOSIS — J4551 Severe persistent asthma with (acute) exacerbation: Secondary | ICD-10-CM

## 2023-07-03 DIAGNOSIS — R768 Other specified abnormal immunological findings in serum: Secondary | ICD-10-CM | POA: Diagnosis not present

## 2023-07-03 DIAGNOSIS — Z579 Occupational exposure to unspecified risk factor: Secondary | ICD-10-CM

## 2023-07-03 DIAGNOSIS — G4700 Insomnia, unspecified: Secondary | ICD-10-CM

## 2023-07-03 NOTE — Progress Notes (Addendum)
OV 04/26/2023  Subjective:  Patient ID: Gregory Byrd, male , DOB: Nov 11, 1971 , age 52 y.o. , MRN: [id number] , ADDRESS: [address] [place] [address] PCP Pcp, No Patient Care Team: Pcp, No as PCP - General  This Provider for this visit: Treatment Team:  Attending Provider: Kalman Shan, MD    04/26/2023 -   Chief Complaint  Patient presents with   Consult    Workers comp.  On forklift, hydrolic lines exploded. Patient got hydrolic fluid splashed in mouth and in face.  Caused headaches and blurred vision.  Incident happened 6 months ago     HPI Gregory Byrd 52 y.o. 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  They went to the main manufactures for hydraulic problems that is seen in car trunks and chairs.  He commutes to [place] and come back to [place].  He joined this job in October 2023.  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  His wife is Interior and spatial designer of HR at [employer].  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  He was wearing goggles but it got into his eyes and mouth.  He then wiped it off.  But then some 30-60 minutes later he started feeling short of breath.  He then went outside to get some fresh air.  Later that night he started having headaches.  Then 2 to 3 days status started having blurred vision.  A few weeks later started developing dry cough along with wheezing.  All the symptoms are persisting.  In fact the blurred vision is worse.  He continues to have headaches.  He is also having fatigue.  His shortness of breath is present particularly with exertion when he climbs 2 flights of stairs at home.  His cough is ongoing.  He says the day after the incident he did seek medical help and was seen by a physician here in  [place] and was advised to go to the emergency room.  His work as a strict 90-day note time off policy.  Therefore he is "fought through" and then only after his 90-day.  He started seekubg medical help in detail only now.  Unclear to me if he seen a neurologist  He does not have a prior history of asthma.  There is no cat in the house no dogs.  No pet birds no mildew no mold.  No down pillow.  No tobacco use no marijuana use no cocaine use.  He says he lives in a good newer home.   Sit/stand exercise hypoxemia test.  His resting pulse ox was 97%.  After sitting and standing 10 times his lowest pulse ox was again only 97%.  Okay he did not get dyspneic but felt mildly dizzy.  He did have a chest x-ray May 2024.  Personally visualized.  Agree it is clear.  CXR 04/09/23  arrative & Impression  CLINICAL DATA:  Shortness of breath.   EXAM: CHEST - 2 VIEW   COMPARISON:  06/16/2019.   FINDINGS: The heart size and mediastinal contours are within normal limits. Both lungs are clear. No acute osseous abnormality.   IMPRESSION: No active cardiopulmonary disease.     Electronically Signed   By: Vernona Rieger  Ladona Ridgel M.D.   On: 04/09/2023 22:27     06/05/2023- interim  Patient presents today for 1 month follow-up.  Worker's Comp. present. He ingested hydrolic fluid at work around [facility] 2024. He develop breathing issues, headaches and blurred vision occurred shortly after exposure. He has had persistent dyspnea symptoms, cough and headaches since exposure. He experiences fatigue symptoms climbing stairs and walking long distances. Cough is not productive, needs to clear his throat because of mucus often. Symptoms can fluctuate. He works at [employer]. 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   He was not aware he had a breathing test scheduled for today. Workers compensation called to  confirm 4 oclock apt and was told that his 10am PFTs was cancelled due to tech difficulty. PFTs will need to be rescheduled. He has not had HRCT imaging done, this has not been scheduled. Echocardiogram was normal. FENO today was 25.    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  Started Job in  October 2023. 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 He was wearing goggles but it got into his eyes and mouth. He then wiped it off. But then some 30-60 minutes later he started feeling short of breath. He then went outside to get some fresh air. Later that night he started having headaches. Then 2 to 3 days status started having blurred vision. A few weeks later started developing dry cough along with wheezing. Persistent symptoms .     TEST  2D echo done on May 21, 2023 showed normal EF, normal pulmonary artery systolic pressure.  No valvular disease     Patient presents for a 2-week follow-up.  Patient has had 6 months of intermittent episodes of shortness of breath chest tightness.  Symptoms started after above exposure to hydraulic fluid at his workplace.  He has no previous diagnosis of asthma.  Says he is always been in very good shape never had any known breathing problems.  He played college sports at [employer].  He played baseball.  Says he is a runner.  No known history of COVID-19 infection.  Says that he has been on light duty at work.  Recently working in an area with lots of dust and paint fumes over the last week his breathing has not been as good.  He has had intermittent cough, wheezing and tightness.  Patient has not been able to go to work for the last 2  days. PFTs done on June 11, 2023 show severe airflow obstruction with mid flow reversibility with FEV1 at 44%, ratio 66, FVC 52%, diffusing capacity 82%.  Patient has an upcoming high-resolution CT chest pending for next week. Fino testing today is elevated at 27ppb     OV 07/03/2023  Subjective:  Patient ID: Gregory Byrd, male , DOB: Feb 26, 1971 , age 50 y.o. , MRN: [id number] , ADDRESS: [address] [place] [address] PCP Patient, No Pcp Per Patient Care Team: Patient, No Pcp Per as PCP - General (General Practice)  This Provider for this  visit: Treatment Team:  Attending Provider: Kalman Shan, MD    07/03/2023 -   Chief Complaint  Patient presents with   Acute Visit    SOB, weak, not sleeping, fatigue.      HPI Gregory Byrd 51 y.o. -presents for follow-up.  His case manager is [employer].  She arrived later.  She tells me that she is being designated by [employer] with a specific goal of supporting him and being his advocate but with the goal of returning him to work.  He he was fine with her present thing for the second part of the visit after the physical exam and sharing medical information.  He tells me that prior to the hydraulic fluid exposure he was completely well.  He denied any cat at home.  He did not have any respiratory complaints.  He was not aware of any dust allergy or dust mite allergy.  He says since the fluid exposure is still not better.  Nurse practitioner started him on Trelegy based on elevated IgE and abnormal obstructive pulmonary function test with a severe level.  He says the Trelegy might have helped but is not sure he is having multiple systemic complaints that include shortness of breath diarrhea headache wheezing and fatigue.  He is also complaining of insomnia but he is on a third shift.  Lab review shows severe obstruction elevated IgE dust mite allergy but normal eosinophils.    [place] Symptom  Assessment Numerical Scale 0 is no problem -> 10 worst problem 07/03/2023    No Pain -> Worst pain 9  No Tiredness -> Worset tiredness 9  No Nausea -> Worst nausea 9  No Depression -> Worst depression 7  No Anxiety -> Worst Anxiety 6  No Drowsiness -> Worst Drowsiness 7  Best appetite-> Worst Appetitle 8  Best Feeling of well being -> Worst feeling 10  No dyspnea-> Worst dyspnea 10  Other problem (none -> severe) x  Completed by  patient         PFT    Latest Ref Rng & Units 06/11/2023   11:08 AM  PFT Results  FVC-Pre L 2.42   FVC-Predicted Pre % 52   FVC-Post L 2.73   FVC-Predicted Post % 58   Pre FEV1/FVC % % 66   Post FEV1/FCV % % 64   FEV1-Pre L 1.59   FEV1-Predicted Pre % 44   FEV1-Post L 1.74   DLCO uncorrected ml/min/mmHg 22.45   DLCO UNC% % 82   DLVA Predicted % 126         LAB RESULTS last 96 hours No results found.  LAB RESULTS last 90 days Recent Results (from the past 2160 hour(s))  CBC     Status: None   Collection Time: 04/09/23  9:39 PM  Result Value Ref Range   WBC 5.1 4.0 - 10.5 K/uL   RBC 4.89 4.22 - 5.81 MIL/uL   Hemoglobin 13.8 13.0 - 17.0 g/dL   HCT 16.1 09.6 - 04.5 %   MCV 83.0 80.0 - 100.0 fL   MCH 28.2 26.0 - 34.0 pg   MCHC 34.0 30.0 - 36.0 g/dL   RDW 40.9 81.1 - 91.4 %   Platelets 192 150 - 400 K/uL   nRBC 0.0 0.0 - 0.2 %    Comment: Performed at [facility], [address] [place], [place] [address]  Comprehensive metabolic panel     Status: Abnormal   Collection Time: 04/09/23  9:39 PM  Result Value Ref Range   Sodium 138 135 - 145 mmol/L   Potassium 3.8 3.5 - 5.1 mmol/L   Chloride 105 98 - 111 mmol/L   CO2 24 22 - 32 mmol/L   Glucose, Bld 121 (H) 70 - 99 mg/dL    Comment: Glucose reference range applies only to samples taken after fasting for at least 8 hours.   BUN 12 6 - 20 mg/dL   Creatinine, Ser 8.41 0.61 - 1.24 mg/dL   Calcium 8.8 (L) 8.9 - 10.3 mg/dL   Total Protein 7.2 6.5 - 8.1 g/dL    Albumin 4.5 3.5 - 5.0 g/dL   AST 13 (L) 15 - 41 U/L   ALT 11 0 - 44 U/L   Alkaline Phosphatase 63 38 - 126 U/L   Total Bilirubin 0.6 0.3 - 1.2 mg/dL   GFR, Estimated >32 >44 mL/min    Comment: (NOTE) Calculated using the CKD-EPI Creatinine Equation (2021)    Anion gap 9 5 - 15    Comment: Performed at [facility], [address] [place], [place] [address]  Perennial allergen profile IgE     Status: Abnormal   Collection Time: 04/26/23  9:22 AM  Result Value Ref Range   Class Description Allergens Comment     Comment:     Levels of Specific IgE       Class  Description of Class     ---------------------------  -----  --------------------                    < 0.10         0         Negative            0.10 -    0.31         0/I       Equivocal/Low            0.32 -    0.55         I         Low            0.56 -    1.40         II        Moderate            1.41 -    3.90         III       High            3.91 -   19.00         IV        Very High           19.01 -  100.00         V         Very High                   >100.00         VI        Very High    D Pteronyssinus IgE 0.28 (A) Class 0/I kU/L   D Farinae IgE 0.93 (A) Class II kU/L   Cat Dander IgE 0.66 (A) Class II kU/L   Dog Dander IgE 0.12 (A) Class 0/I kU/L   Cow Dander IgE <0.10 Class 0 kU/L   Goose Feathers IgE <0.10 Class 0 kU/L   Chicken Feathers IgE <0.10 Class 0 kU/L  Duck Feathers IgE <0.10 Class 0 kU/L   Penicillium Chrysogen IgE <0.10 Class 0 kU/L   Cladosporium Herbarum IgE <0.10 Class 0 kU/L   Aspergillus Fumigatus IgE <0.10 Class 0 kU/L   Mucor Racemosus IgE 0.10 (A) Class 0/I kU/L   Candida Albicans IgE 0.36 (A) Class I kU/L   Alternaria Alternata IgE <0.10 Class 0 kU/L   Setomelanomma Rostrat <0.10 Class 0 kU/L   Aureobasidi Pullulans IgE <0.10 Class 0 kU/L   Phoma Betae IgE <0.10 Class 0 kU/L   Stemphylium Herbarum IgE <0.10 Class 0 kU/L   Mouse Urine IgE <0.10 Class 0 kU/L   IgE     Status: Abnormal   Collection Time: 04/26/23  9:22 AM  Result Value Ref Range   IgE (Immunoglobulin E), Serum 234 (H) <OR=114 kU/L  CBC w/Diff     Status: None   Collection Time: 04/26/23  9:22 AM  Result Value Ref Range   WBC 4.4 4.0 - 10.5 K/uL   RBC 4.92 4.22 - 5.81 Mil/uL   Hemoglobin 13.6 13.0 - 17.0 g/dL   HCT 16.1 09.6 - 04.5 %   MCV 84.9 78.0 - 100.0 fl   MCHC 32.5 30.0 - 36.0 g/dL   RDW 40.9 81.1 - 91.4 %   Platelets 198.0 150.0 - 400.0 K/uL   Neutrophils Relative % 56.6 43.0 - 77.0 %   Lymphocytes Relative 30.8 12.0 - 46.0 %   Monocytes Relative 8.5 3.0 - 12.0 %   Eosinophils Relative 3.3 0.0 - 5.0 %   Basophils Relative 0.8 0.0 - 3.0 %   Neutro Abs 2.5 1.4 - 7.7 K/uL   Lymphs Abs 1.4 0.7 - 4.0 K/uL   Monocytes Absolute 0.4 0.1 - 1.0 K/uL   Eosinophils Absolute 0.1 0.0 - 0.7 K/uL   Basophils Absolute 0.0 0.0 - 0.1 K/uL  ECHOCARDIOGRAM COMPLETE     Status: None   Collection Time: 05/21/23  9:58 AM  Result Value Ref Range   S' Lateral 3.10 cm   AV Area VTI 1.99 cm2   AV Mean grad 5.5 mmHg   Single Plane A4C EF 60.6 %   Single Plane A2C EF 61.8 %   Calc EF 61.6 %   AV Area mean vel 2.08 cm2   Area-P 1/2 3.89 cm2   AR max vel 2.29 cm2   AV Peak grad 10.0 mmHg   Ao pk vel 1.58 m/s   MV VTI 2.75 cm2   Est EF 60 - 65%   POCT EXHALED NITRIC OXIDE     Status: None   Collection Time: 06/05/23  5:34 PM  Result Value Ref Range   FeNO level (ppb) 25   Pulmonary function test     Status: None   Collection Time: 06/11/23 11:08 AM  Result Value Ref Range   FVC-Pre 2.42 L   FVC-%Pred-Pre 52 %   FVC-Post 2.73 L   FVC-%Pred-Post 58 %   FVC-%Change-Post 12 %   FEV1-Pre 1.59 L   FEV1-%Pred-Pre 44 %   FEV1-Post 1.74 L   FEV1-%Pred-Post 48 %   FEV1-%Change-Post 9 %   FEV6-Pre 2.36 L   FEV6-%Pred-Pre 52 %   FEV6-Post 2.70 L   FEV6-%Pred-Post 60 %   FEV6-%Change-Post 14 %   Pre FEV1/FVC ratio 66 %   FEV1FVC-%Pred-Pre 85 %   Post FEV1/FVC ratio 64 %    FEV1FVC-%Change-Post -2 %   Pre FEV6/FVC Ratio 98 %   FEV6FVC-%Pred-Pre 101 %   Post FEV6/FVC ratio 99 %  FEV6FVC-%Pred-Post 102 %   FEV6FVC-%Change-Post 1 %   FEF 25-75 Pre 0.93 L/sec   FEF2575-%Pred-Pre 29 %   FEF 25-75 Post 1.34 L/sec   FEF2575-%Pred-Post 42 %   FEF2575-%Change-Post 43 %   DLCO unc 22.45 ml/min/mmHg   DLCO unc % pred 82 %   DL/VA 8.84 ml/min/mmHg/L   DL/VA % pred 166 %         has no past medical history on file.   reports that he has never smoked. He has never used smokeless tobacco.  No past surgical history on file.  No Known Allergies   There is no immunization history on file for this patient.  No family history on file.   Current Outpatient Medications:    albuterol (VENTOLIN HFA) 108 (90 Base) MCG/ACT inhaler, Inhale 2 puffs into the lungs every 6 (six) hours as needed for wheezing or shortness of breath., Disp: 8 g, Rfl: 2   Fluticasone-Umeclidin-Vilant (TRELEGY ELLIPTA) 100-62.5-25 MCG/ACT AEPB, Inhale 1 puff into the lungs daily., Disp: 1 each, Rfl: 5   gabapentin (NEURONTIN) 300 MG capsule, Take 300 mg by mouth at bedtime as needed., Disp: , Rfl:    predniSONE (DELTASONE) 10 MG tablet, Take 10 mg by mouth. Take as directed, Disp: , Rfl:    predniSONE (DELTASONE) 10 MG tablet, 4 tabs for 2 days, then 3 tabs for 2 days, 2 tabs for 2 days, then 1 tab for 2 days, then stop, Disp: 20 tablet, Rfl: 0      Objective:   Vitals:   07/03/23 1323  BP: (!) 139/90  Pulse: 61  SpO2: 96%  Weight: 169 lb 12.8 oz (77 kg)  Height: 5\' 8"  (1.727 m)    Estimated body mass index is 25.82 kg/m as calculated from the following:   Height as of this encounter: 5\' 8"  (1.727 m).   Weight as of this encounter: 169 lb 12.8 oz (77 kg).  @WEIGHTCHANGE @  [employer]   07/03/23 1323  Weight: 169 lb 12.8 oz (77 kg)     Physical Exam   General: No distress. Looks well O2 at rest: no Cane present: no Sitting in wheel chair: no Frail: no Obese:  no Neuro: Alert and Oriented x 3. GCS 15. Speech normal Psych: Pleasant Resp:  Barrel Chest - no.  Wheeze - n, Crackles - on, No overt respiratory distress CVS: Normal heart sounds. Murmurs - no Ext: Stigmata of Connective Tissue Disease - no HEENT: Normal upper airway. PEERL +. No post nasal drip        Assessment:       ICD-10-CM   1. 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 Severe persistent asthma with acute exacerbation  J45.51     3. H/O chemical exposure  Z87.898     4. Elevated IgE level  R76.8     5. House dust mite allergy  Z91.09     6. Occupational exposure in workplace  Z57.9     7. Insomnia, unspecified type  G47.00      Instructions lower shared with the case manager.  I think he has reactive airway dysfunction syndrome.  He meets criteria that symptoms developed after single exposure and he has asthma phenotype now lasting for over 3 months.  Has other systemic symptoms.  He did have some eye irritation but he is wearing a protective lens but when he removed it he did have eye irritation which also fits in with reactive airway  dysfunction syndrome.  He might have been predisposed because of his previous allergic phenotype with dust mite allergy.    Plan:     Patient Instructions     ICD-10-CM   1. 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 Severe persistent asthma with acute exacerbation  J45.51     3. H/O chemical exposure  Z87.898     4. Elevated IgE level  R76.8     5. House dust mite allergy  Z91.09     6. Occupational exposure in workplace  Z57.9      You previously had some mild dust mite allergy that he were not aware of.  But however already having right now his reactive airway dysfunction syndrome (RADS) which is acute change in symptoms after a single chemical exposure inhalation and then developing asthma phenotype.  Control of this can be difficult and treated much like asthma  Lung functions currently  compromised at a severe level FEV1 1.6 L / 44%  Plan - Workplace restrictions  -Would like this duty to be very light preferably desk job in a very clean environment free of dust, mold and chemicals -Continue Trelegy daily with albuterol as needed -Start Xolair biologic  -Meet pharmacy for counseling about risks and side effects - Home dust mite control; see below -Keep appointment for CT scan of the chest -Do spirometry  in 8 weeks - address insomnia and systemic symptoms in future  Follow-up - Meet with the pharmacist - 8 weeks with nurse practitioner to ensure uptake with Xolair and follow-up pulmonary function testing   Xxxxxx   Google Dust Mite Allergy and get info from Asthma and Allergy [employer] and pillows in zippered dust-proof covers. These covers are made of a material with pores too small to let dust mites and their waste product through. They are also called allergen-impermeable. Plastic or vinyl covers are the least expensive, but some people find them uncomfortable. You can buy other fabric allergen-impermeable covers from many regular bedding stores.   Wash your sheets and blankets weekly in hot water. You have to wash them in water that's at least 130 F or more to kill dust mites.  Get rid of all types of fabric that mites love and that you cannot easily wash regularly in hot water.   Avoid wall-to-wall carpeting, curtains, blinds, upholstered furniture and down-filled covers and pillows in the bedroom. Put roll-type shades on your windows instead of curtains.  Have someone without a dust mite allergy clean your bedroom. If this is not possible, wear a filtering mask when dusting or vacuuming. Many drug stores carry these items. Dusting and vacuuming stir up dust. So try to do these chores when you can stay out of the bedroom for a while afterward.   Special CERTIFIED filter vacuum cleaners can help to keep mites and mite waste from  getting back into the air. CERTIFIED vacuums are scientifically tested and verified as more suitable for making your home healthier.  Vacuuming is not enough to remove all dust mites and their waste. A large amount of the dust mite population may remain because they live deep inside the stuffing of sofas, chairs, mattresses, pillows, and carpeting.  Treat other rooms in your house like your bedroom. Here are more tips:  Avoid wall-to-wall carpeting, if possible. If you do use carpeting, mites don't like the type with a short, tight pile as much. Use washable throw rugs over  regularly damp-mopped wood, linoleum or tiled floors. Wash rugs in hot water whenever possible. Cold water isn't as effective. Dry cleaning kills all dust mites and is also good for removing dust mites from living in fabrics. Keep the humidity in your home less than 50%. Use a dehumidifier and/or air conditioner to do this. Use a CERTIFIED filter with your central furnace and air conditioning unit. This can help trap dust mites from your entire home. Freestanding air cleaners only filter air in a limited area. Avoid devices that treat air with heat, electrostatic ions, or ozon     FOLLOWUP Return in about 8 weeks (around 08/28/2023) for 30 min visit, with any of the APPS.    SIGNATURE    Dr. Kalman Shan, M.D., F.C.C.P,  Pulmonary and Critical Care Medicine Staff Physician, [facility] Director - Interstitial Lung Disease  Program  Pulmonary Fibrosis [facility] at [facility] [place], [place], [address]  Pager: [phone], If no answer or between  15:00h - 7:00h: call [phone] Telephone: [phone]  1:55 PM 07/03/2023

## 2023-07-03 NOTE — Patient Instructions (Addendum)
ICD-10-CM   1. 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 Severe persistent asthma with acute exacerbation  J45.51     3. H/O chemical exposure  Z87.898     4. Elevated IgE level  R76.8     5. House dust mite allergy  Z91.09     6. Occupational exposure in workplace  Z57.9      You previously had some mild dust mite allergy that he were not aware of.  But however already having right now his reactive airway dysfunction syndrome (RADS) which is acute change in symptoms after a single chemical exposure inhalation and then developing asthma phenotype.  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 environment free of dust, mold and chemicals -Continue Trelegy daily with albuterol as needed -Start Xolair biologic  -Meet pharmacy for counseling about risks and side effects - Home dust mite control; see below -Keep appointment for CT scan of the chest -Do spirometry  in 8 weeks - address insomnia and systemic symptoms in future  Follow-up - Meet with the pharmacist - 8 weeks with nurse practitioner to ensure uptake with Xolair and follow-up pulmonary function testing   Xxxxxx   Google Dust Mite Allergy and get info from Asthma and Allergy [employer] and pillows in zippered dust-proof covers. These covers are made of a material with pores too small to let dust mites and their waste product through. They are also called allergen-impermeable. Plastic or vinyl covers are the least expensive, but some people find them uncomfortable. You can buy other fabric allergen-impermeable covers from many regular bedding stores.   Wash your sheets and blankets weekly in hot water. You have to wash them in water that's at least 130 F or more to kill dust  mites.  Get rid of all types of fabric that mites love and that you cannot easily wash regularly in hot water.   Avoid wall-to-wall carpeting, curtains, blinds, upholstered furniture and down-filled covers and pillows in the bedroom. Put roll-type shades on your windows instead of curtains.  Have someone without a dust mite allergy clean your bedroom. If this is not possible, wear a filtering mask when dusting or vacuuming. Many drug stores carry these items. Dusting and vacuuming stir up dust. So try to do these chores when you can stay out of the bedroom for a while afterward.   Special CERTIFIED filter vacuum cleaners can help to keep mites and mite waste from getting back into the air. CERTIFIED vacuums are scientifically tested and verified as more suitable for making your home healthier.  Vacuuming is not enough to remove all dust mites and their waste. A large amount of the dust mite population may remain because they live deep inside the stuffing of sofas, chairs, mattresses, pillows, and carpeting.  Treat other rooms in your house like your bedroom. Here are more tips:  Avoid wall-to-wall carpeting, if possible. If you do use carpeting, mites don't like the type with a short, tight pile as much. Use washable throw rugs over regularly damp-mopped wood, linoleum or tiled floors. Wash rugs in hot water whenever possible. Cold water isn't as effective. Dry cleaning kills all dust mites and is also good for removing dust mites  from living in fabrics. Keep the humidity in your home less than 50%. Use a dehumidifier and/or air conditioner to do this. Use a CERTIFIED filter with your central furnace and air conditioning unit. This can help trap dust mites from your entire home. Freestanding air cleaners only filter air in a limited area. Avoid devices that treat air with heat, electrostatic ions, or ozon

## 2023-07-03 NOTE — Addendum Note (Signed)
Addended by: Hedda Slade on: 07/03/2023 02:14 PM   Modules accepted: Orders

## 2023-07-04 NOTE — Telephone Encounter (Signed)
Patient seen in office 07/03/23. Nothing further needed at this time.

## 2023-07-05 ENCOUNTER — Telehealth: Payer: Self-pay | Admitting: Internal Medicine

## 2023-07-05 NOTE — Telephone Encounter (Signed)
I received an email from the patient's Workmen's Comp case manager, Lawson Fiscal [email or web address]   She accompanied the patient to his 8/13 appointment with Dr. Marchelle Gearing and will need to be present at the follow-up appointment scheduled for Oct 18.  She has asked to have that appointment moved to a different date and I have notified the Front Staff to call her to make this change.  I have also emailed Ms. Blythe the office visit notes from the 8/13 appointment.

## 2023-07-06 ENCOUNTER — Other Ambulatory Visit (HOSPITAL_COMMUNITY): Payer: Self-pay

## 2023-07-06 ENCOUNTER — Telehealth: Payer: Self-pay

## 2023-07-06 DIAGNOSIS — R768 Other specified abnormal immunological findings in serum: Secondary | ICD-10-CM

## 2023-07-06 DIAGNOSIS — J4550 Severe persistent asthma, uncomplicated: Secondary | ICD-10-CM

## 2023-07-06 NOTE — Telephone Encounter (Signed)
Based on on pre-treatment weight of 77 kg and IgE of 234 - Xolair dosing would be 225mg  every 2 weeks  Chesley Mires, PharmD, MPH, BCPS, CPP Clinical Pharmacist (Rheumatology and Pulmonology)

## 2023-07-06 NOTE — Telephone Encounter (Signed)
Seen by Dr. Marchelle Gearing this week will send to him for work note.

## 2023-07-06 NOTE — Telephone Encounter (Signed)
Received New start paperwork for [facility]. Will update as we work through the benefits process.

## 2023-07-06 NOTE — Telephone Encounter (Signed)
Submitted a Prior Authorization request to [employer] for [employer] via CoverMyMeds. Authorization has been APPROVED from 06/06/2023 to 11/03/2023.  Approval letter has not yet been received.   Patient must fill through [facility]: [phone]    CMM KEY: [id number] Authorization#: [id number]   The PA approval message includes an interesting reference to a denied case ID, at this time it is unclear if it has any significance or if it is simply a technical error.

## 2023-07-06 NOTE — Telephone Encounter (Signed)
I cannot be extending work note repeatedly intermittently.  He wanted until 07/06/2023 which is today.  What test is he having and with whom?.  Can he asked those doctors to extend?

## 2023-07-07 ENCOUNTER — Other Ambulatory Visit: Payer: BC Managed Care – PPO

## 2023-07-07 ENCOUNTER — Ambulatory Visit
Admission: RE | Admit: 2023-07-07 | Discharge: 2023-07-07 | Disposition: A | Discharge status: Home / Self Care | Payer: Worker's Compensation | Source: Ambulatory Visit

## 2023-07-07 DIAGNOSIS — S66911A Strain of unspecified muscle, fascia and tendon at wrist and hand level, right hand, initial encounter: Secondary | ICD-10-CM

## 2023-07-07 DIAGNOSIS — M25561 Pain in right knee: Secondary | ICD-10-CM

## 2023-07-07 DIAGNOSIS — M25531 Pain in right wrist: Secondary | ICD-10-CM

## 2023-07-09 NOTE — Telephone Encounter (Signed)
Enrolled pt into Xolair Copay Card program:  RxBIN: [id number] RxPCN: [id number] RxGRP: [id number] ID: [id number]

## 2023-07-10 ENCOUNTER — Other Ambulatory Visit: Payer: Self-pay | Admitting: Nurse Practitioner

## 2023-07-10 ENCOUNTER — Ambulatory Visit: Payer: BC Managed Care – PPO | Admitting: Primary Care

## 2023-07-10 DIAGNOSIS — S66911S Strain of unspecified muscle, fascia and tendon at wrist and hand level, right hand, sequela: Secondary | ICD-10-CM

## 2023-07-10 DIAGNOSIS — S66911A Strain of unspecified muscle, fascia and tendon at wrist and hand level, right hand, initial encounter: Secondary | ICD-10-CM

## 2023-07-17 MED ORDER — XOLAIR 150 MG/ML ~~LOC~~ SOAJ
150.0000 mg | SUBCUTANEOUS | 1 refills | Status: AC
Start: 2023-07-17 — End: ?

## 2023-07-17 MED ORDER — XOLAIR 75 MG/0.5ML ~~LOC~~ SOAJ
75.0000 mg | SUBCUTANEOUS | 1 refills | Status: AC
Start: 2023-07-17 — End: ?

## 2023-07-17 NOTE — Telephone Encounter (Signed)
Rx sent to [facility] for Xolair 225mg  autoinjector's every 2 weeks with note to deliver to clinic.  Will f/u with pharmacy at end of week since [facility] takes 7-10 business days to process new rx's  Chesley Mires, PharmD, MPH, BCPS, CPP Clinical Pharmacist (Rheumatology and Pulmonology)

## 2023-07-18 ENCOUNTER — Other Ambulatory Visit: Payer: BC Managed Care – PPO

## 2023-07-30 ENCOUNTER — Encounter (HOSPITAL_COMMUNITY): Payer: BC Managed Care – PPO

## 2023-07-31 NOTE — Telephone Encounter (Signed)
ATC patient regarding Xolair new start. Unable to reach. Left VM  Provided copay card information for Xolair to [facility] via provider portal  Chesley Mires, PharmD, MPH, BCPS, CPP Clinical Pharmacist (Rheumatology and Pulmonology)

## 2023-08-01 ENCOUNTER — Ambulatory Visit
Admission: RE | Admit: 2023-08-01 | Discharge: 2023-08-01 | Disposition: A | Discharge status: Home / Self Care | Payer: Worker's Compensation | Source: Ambulatory Visit

## 2023-08-01 ENCOUNTER — Ambulatory Visit
Admission: RE | Admit: 2023-08-01 | Discharge: 2023-08-01 | Disposition: A | Discharge status: Home / Self Care | Payer: Worker's Compensation | Source: Ambulatory Visit | Attending: Nurse Practitioner | Admitting: Nurse Practitioner

## 2023-08-01 DIAGNOSIS — S66911S Strain of unspecified muscle, fascia and tendon at wrist and hand level, right hand, sequela: Secondary | ICD-10-CM

## 2023-08-01 DIAGNOSIS — S66911A Strain of unspecified muscle, fascia and tendon at wrist and hand level, right hand, initial encounter: Secondary | ICD-10-CM

## 2023-08-11 ENCOUNTER — Other Ambulatory Visit: Payer: BC Managed Care – PPO

## 2023-08-13 ENCOUNTER — Other Ambulatory Visit: Payer: BC Managed Care – PPO

## 2023-09-07 ENCOUNTER — Ambulatory Visit: Payer: BC Managed Care – PPO | Admitting: Internal Medicine

## 2024-07-18 ENCOUNTER — Other Ambulatory Visit: Payer: Self-pay | Admitting: Primary Care

## 2024-07-19 ENCOUNTER — Telehealth (HOSPITAL_BASED_OUTPATIENT_CLINIC_OR_DEPARTMENT_OTHER): Payer: Self-pay | Admitting: Emergency Medicine

## 2024-07-19 ENCOUNTER — Encounter (HOSPITAL_BASED_OUTPATIENT_CLINIC_OR_DEPARTMENT_OTHER): Payer: Self-pay | Admitting: Emergency Medicine

## 2024-07-19 ENCOUNTER — Other Ambulatory Visit: Payer: Self-pay

## 2024-07-19 ENCOUNTER — Emergency Department (HOSPITAL_BASED_OUTPATIENT_CLINIC_OR_DEPARTMENT_OTHER)
Admission: EM | Admit: 2024-07-19 | Discharge: 2024-07-19 | Disposition: A | Discharge status: Home / Self Care | Attending: Emergency Medicine | Admitting: Emergency Medicine

## 2024-07-19 DIAGNOSIS — L299 Pruritus, unspecified: Secondary | ICD-10-CM | POA: Insufficient documentation

## 2024-07-19 DIAGNOSIS — R21 Rash and other nonspecific skin eruption: Secondary | ICD-10-CM | POA: Diagnosis present

## 2024-07-19 MED ORDER — VALACYCLOVIR HCL 1 G PO TABS: 1000.0000 mg | ORAL_TABLET | Freq: Three times a day (TID) | ORAL | 0 refills | Status: DC

## 2024-07-19 MED ORDER — FLUCONAZOLE 150 MG PO TABS: 150.0000 mg | ORAL_TABLET | ORAL | 0 refills | Status: AC

## 2024-07-19 MED ORDER — FLUCONAZOLE 150 MG PO TABS
150.0000 mg | ORAL_TABLET | Freq: Once | ORAL | Status: AC
  Administered 2024-07-19: 150 mg via ORAL
  Filled 2024-07-19: qty 1

## 2024-07-19 NOTE — ED Triage Notes (Signed)
  Patient comes in with rash in his groin that has been going on for about 2 weeks.  Patient was seen by PCP and given diflucan  for possible yeast infection.  Followed up with dermatologist and was given topical cream and medicated wash.  Patient states the rash and itching has gotten worse.  Attempted to take a bath with some baking soda/salt but did not help.  Denies any pain.  No other OTC meds taken.

## 2024-07-19 NOTE — Discharge Instructions (Signed)
 You were evaluated in the Emergency Department and after careful evaluation, we did not find any emergent condition requiring admission or further testing in the hospital.  Your exam/testing today is overall reassuring.  Can try the Diflucan  for the next few weeks and continue follow-up with dermatology.  Please return to the Emergency Department if you experience any worsening of your condition.   Thank you for allowing us  to be a part of your care.

## 2024-07-19 NOTE — ED Provider Notes (Signed)
 [facility] Emergency Department Provider Note MRN:  [id number]  Arrival date & time: 07/19/24     Chief Complaint   Rash   History of Present Illness   Gregory Byrd is a 53 y.o. year-old male with no pertinent past medical history presenting to the ED with chief complaint of rash.  Years of rash in the genital region, worse tonight.  Saw dermatologist who changed his medications but the new medicines are not helping.  Denies fever, no other complaints.  Review of Systems  A thorough review of systems was obtained and all systems are negative except as noted in the HPI and PMH.   Patient's Health History   History reviewed. No pertinent past medical history.  History reviewed. No pertinent surgical history.  History reviewed. No pertinent family history.  Social History   Socioeconomic History   Marital status: Single    Spouse name: Not on file   Number of children: Not on file   Years of education: Not on file   Highest education level: Not on file  Occupational History   Not on file  Tobacco Use   Smoking status: Never   Smokeless tobacco: Never  Substance and Sexual Activity   Alcohol use: Never   Drug use: Never   Sexual activity: Never  Other Topics Concern   Not on file  Social History Narrative   Not on file   Social Drivers of Health   Financial Resource Strain: Not on file  Food Insecurity: Not on file  Transportation Needs: Not on file  Physical Activity: Not on file  Stress: Not on file  Social Connections: Not on file  Intimate Partner Violence: Not on file     Physical Exam   Vitals:   07/19/24 0019 07/19/24 0030  BP: (!) 156/86 (!) 160/103  Pulse: 64 68  Resp: 16 20  Temp: 98.2 F (36.8 C)   SpO2: 98% 98%    CONSTITUTIONAL: Well-appearing, NAD NEURO/PSYCH:  Alert and oriented x 3, no focal deficits EYES:  eyes equal and reactive ENT/NECK:  no LAD, no JVD CARDIO: Regular rate, well-perfused, normal S1  and S2 PULM:  CTAB no wheezing or rhonchi GI/GU:  non-distended, non-tender MSK/SPINE:  No gross deformities, no edema SKIN:  no rash, atraumatic   *Additional and/or pertinent findings included in MDM below  Diagnostic and Interventional Summary    EKG Interpretation Date/Time:    Ventricular Rate:    PR Interval:    QRS Duration:    QT Interval:    QTC Calculation:   R Axis:      Text Interpretation:         Labs Reviewed - No data to display  No orders to display    Medications  fluconazole  (DIFLUCAN ) tablet 150 mg (150 mg Oral Given 07/19/24 0130)     Procedures  /  Critical Care Procedures  ED Course and Medical Decision Making  Initial Impression and Ddx No appreciable rash of the genital region, vitals reassuring.  Past medical/surgical history that increases complexity of ED encounter: None  Interpretation of Diagnostics Laboratory and/or imaging options to aid in the diagnosis/care of the patient were considered.  After careful history and physical examination, it was determined that there was no indication for diagnostics at this time. Patient Reassessment and Ultimate Disposition/Management     No emergent process appropriate for discharge.  Patient management required discussion with the following services or consulting groups:  None  Complexity of  Problems Addressed Acute complicated illness or Injury  Additional Data Reviewed and Analyzed Further history obtained from: None  Additional Factors Impacting ED Encounter Risk Prescriptions  Ozell HERO. Theadore, MD [facility] Emergency Medicine [facility] [email or web address]  Final Clinical Impressions(s) / ED Diagnoses     ICD-10-CM   1. Itchy skin  L29.9       ED Discharge Orders          Ordered    fluconazole  (DIFLUCAN ) 150 MG tablet  Weekly        07/19/24 0124             Discharge Instructions Discussed with and Provided to Patient:    Discharge  Instructions      You were evaluated in the Emergency Department and after careful evaluation, we did not find any emergent condition requiring admission or further testing in the hospital.  Your exam/testing today is overall reassuring.  Can try the Diflucan  for the next few weeks and continue follow-up with dermatology.  Please return to the Emergency Department if you experience any worsening of your condition.   Thank you for allowing us  to be a part of your care.      Theadore Ozell HERO, MD 07/19/24 [phone]

## 2024-11-14 ENCOUNTER — Other Ambulatory Visit: Payer: Self-pay

## 2024-11-14 ENCOUNTER — Encounter (HOSPITAL_BASED_OUTPATIENT_CLINIC_OR_DEPARTMENT_OTHER): Payer: Self-pay

## 2024-11-14 ENCOUNTER — Emergency Department (HOSPITAL_BASED_OUTPATIENT_CLINIC_OR_DEPARTMENT_OTHER)

## 2024-11-14 ENCOUNTER — Emergency Department (HOSPITAL_BASED_OUTPATIENT_CLINIC_OR_DEPARTMENT_OTHER)
Admission: EM | Admit: 2024-11-14 | Discharge: 2024-11-14 | Disposition: A | Discharge status: Home / Self Care | Attending: Emergency Medicine | Admitting: Emergency Medicine

## 2024-11-14 DIAGNOSIS — B009 Herpesviral infection, unspecified: Secondary | ICD-10-CM | POA: Diagnosis not present

## 2024-11-14 DIAGNOSIS — M545 Low back pain, unspecified: Secondary | ICD-10-CM | POA: Diagnosis present

## 2024-11-14 DIAGNOSIS — M549 Dorsalgia, unspecified: Secondary | ICD-10-CM

## 2024-11-14 DIAGNOSIS — R10A0 Flank pain, unspecified side: Secondary | ICD-10-CM | POA: Diagnosis not present

## 2024-11-14 LAB — BASIC METABOLIC PANEL WITH GFR
Anion gap: 9 (ref 5–15)
BUN: 11 mg/dL (ref 6–20)
CO2: 25 mmol/L (ref 22–32)
Calcium: 9.4 mg/dL (ref 8.9–10.3)
Chloride: 108 mmol/L (ref 98–111)
Creatinine, Ser: 1.05 mg/dL (ref 0.61–1.24)
GFR, Estimated: >60 mL/min
Glucose, Bld: 106 mg/dL — ABNORMAL HIGH (ref 70–99)
Potassium: 3.9 mmol/L (ref 3.5–5.1)
Sodium: 142 mmol/L (ref 135–145)

## 2024-11-14 LAB — URINALYSIS, ROUTINE W REFLEX MICROSCOPIC
Bilirubin Urine: NEGATIVE
Glucose, UA: NEGATIVE mg/dL
Hgb urine dipstick: NEGATIVE
Ketones, ur: NEGATIVE mg/dL
Leukocytes,Ua: NEGATIVE
Nitrite: NEGATIVE
Protein, ur: NEGATIVE mg/dL
Specific Gravity, Urine: 1.013 (ref 1.005–1.030)
pH: 6.5 (ref 5.0–8.0)

## 2024-11-14 LAB — CBC
HCT: 39.1 % (ref 39.0–52.0)
Hemoglobin: 13.1 g/dL (ref 13.0–17.0)
MCH: 27.3 pg (ref 26.0–34.0)
MCHC: 33.5 g/dL (ref 30.0–36.0)
MCV: 81.6 fL (ref 80.0–100.0)
Platelets: 156 K/uL (ref 150–400)
RBC: 4.79 MIL/uL (ref 4.22–5.81)
RDW: 15.3 % (ref 11.5–15.5)
WBC: 3.6 K/uL — ABNORMAL LOW (ref 4.0–10.5)
nRBC: 0 % (ref 0.0–0.2)

## 2024-11-14 MED ORDER — VALACYCLOVIR HCL 1 G PO TABS: 1000.0000 mg | ORAL_TABLET | Freq: Three times a day (TID) | ORAL | 0 refills | Status: DC

## 2024-11-14 MED ORDER — VALACYCLOVIR HCL 1 G PO TABS: 1000.0000 mg | ORAL_TABLET | Freq: Three times a day (TID) | ORAL | 1 refills | Status: AC

## 2024-11-14 MED ORDER — IBUPROFEN 800 MG PO TABS
800.0000 mg | ORAL_TABLET | Freq: Once | ORAL | Status: AC
  Administered 2024-11-14: 800 mg via ORAL
  Filled 2024-11-14: qty 1

## 2024-11-14 NOTE — Discharge Instructions (Signed)
 You were seen in the Emergency Department for back pain Your blood work and other test looked okay The CAT scan did not show any kidney stones We have called in a prescription refill for your Valtrex  to your pharmacy Take Tylenol Motrin  as directed for back pain Return to the emergency room for severe pain or other concerns

## 2024-11-14 NOTE — ED Provider Notes (Signed)
 " [name] [facility] AT [facility] Provider Note   CSN: [id number] Arrival date & time: 11/14/24  1109     Patient presents with: Flank Pain   Gregory Byrd is a 53 y.o. male.  Who presents to ED for lower back pain.  Patient has experienced viral symptoms as well as aches and night sweats 2 nights ago.  Today his lower back started to hurt.  Localized bilateral flanks no injury or trauma.  No prior history of kidney stone.    Flank Pain       Prior to Admission medications  Medication Sig Start Date End Date Taking? Authorizing Provider  oxyCODONE-acetaminophen (PERCOCET) 10-325 MG tablet Take 1 tablet by mouth 4 (four) times daily as needed. 11/07/24  Yes [provider]  albuterol  (VENTOLIN  HFA) 108 (90 Base) MCG/ACT inhaler Inhale 2 puffs into the lungs every 6 (six) hours as needed for wheezing or shortness of breath. 06/05/23   Hope Almarie ORN, NP  fluconazole  (DIFLUCAN ) 150 MG tablet Take 1 tablet (150 mg total) by mouth once a week. 07/19/24   Bero, Lakeva Hollon M, MD  Fluticasone-Umeclidin-Vilant (TRELEGY ELLIPTA ) 100-62.5-25 MCG/ACT AEPB Inhale 1 puff into the lungs daily. 06/18/23   Parrett, Madelin RAMAN, NP  gabapentin (NEURONTIN) 300 MG capsule Take 300 mg by mouth at bedtime as needed. 06/01/23   [provider]  omalizumab  (XOLAIR ) 150 MG/ML pen Inject 150 mg into the skin every 14 (fourteen) days. TOTAL DOSE 225mg  every 2 weeks. Deliver to [facility]: [address], [place], [place] [address] 07/17/23   Geronimo Amel, MD  omalizumab  (XOLAIR ) 75 MG/0.5ML pen Inject 75 mg into the skin every 14 (fourteen) days. TOTAL DOSE 225mg  every 2 weeks. Deliver to [facility]: [address], [place], [place] [address] 07/17/23   Geronimo Amel, MD  predniSONE  (DELTASONE ) 10 MG tablet Take 10 mg by mouth. Take as directed 06/01/23   [provider]  predniSONE  (DELTASONE ) 10 MG tablet 4 tabs for 2 days, then 3  tabs for 2 days, 2 tabs for 2 days, then 1 tab for 2 days, then stop 06/18/23   Parrett, Tammy S, NP  valACYclovir  (VALTREX ) 1000 MG tablet Take 1 tablet (1,000 mg total) by mouth 3 (three) times daily. 11/14/24   Pamella Ozell LABOR, DO    Allergies: Patient has no known allergies.    Review of Systems  Genitourinary:  Positive for flank pain.    Updated Vital Signs BP (!) 153/96   Pulse (!) 47   Temp 98.6 F (37 C) (Oral)   Resp 16   Ht 5' 9 (1.753 m)   Wt 74.8 kg   SpO2 99%   BMI 24.37 kg/m   Physical Exam Vitals and nursing note reviewed.  HENT:     Head: Normocephalic and atraumatic.  Eyes:     Pupils: Pupils are equal, round, and reactive to light.  Cardiovascular:     Rate and Rhythm: Normal rate and regular rhythm.  Pulmonary:     Effort: Pulmonary effort is normal.     Breath sounds: Normal breath sounds.  Abdominal:     Palpations: Abdomen is soft.     Tenderness: There is no abdominal tenderness.  Musculoskeletal:     Comments: Bilateral paraspinal lumbar tenderness no midline tender step-off deformity Full motor strength upper and lower extremities  Skin:    General: Skin is warm and dry.  Neurological:     Mental Status:  He is alert.  Psychiatric:        Mood and Affect: Mood normal.     (all labs ordered are listed, but only abnormal results are displayed) Labs Reviewed  CBC - Abnormal; Notable for the following components:      Result Value   WBC 3.6 (*)    All other components within normal limits  BASIC METABOLIC PANEL WITH GFR - Abnormal; Notable for the following components:   Glucose, Bld 106 (*)    All other components within normal limits  URINALYSIS, ROUTINE W REFLEX MICROSCOPIC    EKG: None  Radiology: CT Renal Stone Study Result Date: 11/14/2024 EXAM: CT UROGRAM 11/14/2024 01:09:46 PM TECHNIQUE: CT of the abdomen and pelvis was performed without the administration of intravenous contrast as per CT urogram protocol. Multiplanar  reformatted images as well as MIP urogram images are provided for review. Automated exposure control, iterative reconstruction, and/or weight based adjustment of the mA/kV was utilized to reduce the radiation dose to as low as reasonably achievable. COMPARISON: None available. CLINICAL HISTORY: Abdominal/flank pain, stone suspected. FINDINGS: LOWER CHEST: No acute abnormality. LIVER: The liver is unremarkable. GALLBLADDER AND BILE DUCTS: Gallbladder is unremarkable. No biliary ductal dilatation. SPLEEN: No acute abnormality. PANCREAS: No acute abnormality. ADRENAL GLANDS: No acute abnormality. KIDNEYS, URETERS AND BLADDER: Nephrolithiasis, ureterolithiasis, or obstructing uropathy. No perinephric or periureteral stranding. Urinary bladder is unremarkable. GI AND BOWEL: Stomach demonstrates no acute abnormality. There is no bowel obstruction. PERITONEUM AND RETROPERITONEUM: No ascites. No free air. VASCULATURE: Aorta is normal in caliber. LYMPH NODES: No lymphadenopathy. REPRODUCTIVE ORGANS: No acute abnormality. BONES AND SOFT TISSUES: No acute osseous abnormality. No focal soft tissue abnormality. IMPRESSION: 1. No nephrolithiasis, ureterolithiasis, or obstructing uropathy. Electronically signed by: Norleen Boxer MD 11/14/2024 01:28 PM EST RP Workstation: [id number]     Procedures   Medications Ordered in the ED  ibuprofen  (ADVIL ) tablet 800 mg (800 mg Oral Given 11/14/24 1736)                                    Medical Decision Making 53 year old male with history as above presenting to the ED for lower paraspinal back pain.  No prior history of kidney stones.  Laboratory workup unremarkable.  CT negative for stones and UA is negative.  Will discharge with instruction for symptomatic management for likely musculoskeletal strain sprain.  Patient requesting refill for Valtrex  which will provide him with  Amount and/or Complexity of Data Reviewed Labs: ordered.  Risk Prescription drug  management.        Final diagnoses:  Acute bilateral back pain, unspecified back location  Herpes    ED Discharge Orders          Ordered    valACYclovir  (VALTREX ) 1000 MG tablet  3 times daily,   Status:  Discontinued        11/14/24 1728    valACYclovir  (VALTREX ) 1000 MG tablet  3 times daily        11/14/24 1728               Pamella Ozell LABOR, DO 11/14/24 1752  "

## 2024-11-14 NOTE — ED Triage Notes (Signed)
 Pt reports urine dark this AM and some flank/back pain. Pt reports he has not been staying hydrated recently.
